# Patient Record
Sex: Male | Born: 1990 | Race: Black or African American | Hispanic: No | Marital: Single | Smoking: Current every day smoker
Health system: Southern US, Community
[De-identification: ages and names within clinical notes are randomized; demographics above are authoritative.]

## PROBLEM LIST (undated history)

## (undated) DIAGNOSIS — I10 Essential (primary) hypertension: Secondary | ICD-10-CM

## (undated) DIAGNOSIS — E119 Type 2 diabetes mellitus without complications: Secondary | ICD-10-CM

## (undated) DIAGNOSIS — E785 Hyperlipidemia, unspecified: Secondary | ICD-10-CM

## (undated) HISTORY — DX: Type 2 diabetes mellitus without complications: E11.9

---
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
## 2014-06-24 ENCOUNTER — Encounter (HOSPITAL_COMMUNITY): Payer: Self-pay | Admitting: Emergency Medicine

## 2014-06-24 ENCOUNTER — Emergency Department (HOSPITAL_COMMUNITY): Payer: Self-pay

## 2014-06-24 ENCOUNTER — Emergency Department (HOSPITAL_COMMUNITY)
Admission: EM | Admit: 2014-06-24 | Discharge: 2014-06-24 | Disposition: A | Discharge status: Home / Self Care | Payer: Self-pay | Attending: Emergency Medicine | Admitting: Emergency Medicine

## 2014-06-24 DIAGNOSIS — S61411A Laceration without foreign body of right hand, initial encounter: Secondary | ICD-10-CM | POA: Insufficient documentation

## 2014-06-24 DIAGNOSIS — W25XXXA Contact with sharp glass, initial encounter: Secondary | ICD-10-CM | POA: Insufficient documentation

## 2014-06-24 DIAGNOSIS — Y998 Other external cause status: Secondary | ICD-10-CM | POA: Insufficient documentation

## 2014-06-24 DIAGNOSIS — Z23 Encounter for immunization: Secondary | ICD-10-CM | POA: Insufficient documentation

## 2014-06-24 DIAGNOSIS — Y9289 Other specified places as the place of occurrence of the external cause: Secondary | ICD-10-CM | POA: Insufficient documentation

## 2014-06-24 DIAGNOSIS — Y9389 Activity, other specified: Secondary | ICD-10-CM | POA: Insufficient documentation

## 2014-06-24 MED ORDER — TETANUS-DIPHTH-ACELL PERTUSSIS 5-2.5-18.5 LF-MCG/0.5 IM SUSP
0.5000 mL | Freq: Once | INTRAMUSCULAR | Status: AC
  Administered 2014-06-24: 0.5 mL via INTRAMUSCULAR
  Filled 2014-06-24: qty 0.5

## 2014-06-24 MED ORDER — HYDROCODONE-ACETAMINOPHEN 5-325 MG PO TABS: 1.0000 | ORAL_TABLET | ORAL | Status: DC | PRN

## 2014-06-24 MED ORDER — CEPHALEXIN 250 MG PO CAPS
500.0000 mg | ORAL_CAPSULE | Freq: Once | ORAL | Status: AC
  Administered 2014-06-24: 500 mg via ORAL
  Filled 2014-06-24: qty 2

## 2014-06-24 MED ORDER — LIDOCAINE HCL 2 % IJ SOLN
20.0000 mL | Freq: Once | INTRAMUSCULAR | Status: AC
  Administered 2014-06-24: 400 mg
  Filled 2014-06-24: qty 20

## 2014-06-24 MED ORDER — CEPHALEXIN 500 MG PO CAPS: 500.0000 mg | ORAL_CAPSULE | Freq: Four times a day (QID) | ORAL | Status: DC

## 2014-06-24 NOTE — ED Provider Notes (Signed)
CSN: [id number]     Arrival date & time 06/24/14  1115 History  This chart was scribed for non-physician practitioner, Marlon Peliffany Lanee Chain, PA-C, working with Lorre NickAnthony Allen, MD, by Ronney LionSuzanne Le, ED Scribe. This patient was seen in room TR09C/TR09C and the patient's care was started at 1:53 PM.      Chief Complaint  Patient presents with  . Extremity Laceration   The history is provided by the patient. No language interpreter was used.    HPI Comments: Jerome Hill is a 24 y.o. male who presents to the Emergency Department complaining of a deep laceration to his right hand, to the first and second digits. Patient was holding a mug this morning during breakfast when he set it too hard on the counter it broke  and cut his hand. He states he can't remember his last tetanus shot.   History reviewed. No pertinent past medical history. History reviewed. No pertinent past surgical history. History reviewed. No pertinent family history. History  Substance Use Topics  . Smoking status: Never Smoker   . Smokeless tobacco: Not on file  . Alcohol Use: No    Review of Systems  A complete 10 system review of systems was obtained and all systems are negative except as noted in the HPI and PMH.    Allergies  Review of patient's allergies indicates no known allergies.  Home Medications   Prior to Admission medications   Medication Sig Start Date End Date Taking? Authorizing Provider  cephALEXin (KEFLEX) 500 MG capsule Take 1 capsule (500 mg total) by mouth 4 (four) times daily. 06/24/14   Marlon Peliffany Fines Kimberlin, PA-C  HYDROcodone-acetaminophen (NORCO/VICODIN) 5-325 MG per tablet Take 1-2 tablets by mouth every 4 (four) hours as needed. 06/24/14   Zianne Schubring Neva SeatGreene, PA-C   BP 145/88 mmHg  Pulse 77  Temp(Src) 98.3 F (36.8 C) (Oral)  Resp 20  Ht 6' (1.829 m)  Wt 270 lb (122.471 kg)  BMI 36.61 kg/m2  SpO2 99% Physical Exam  Constitutional: He is oriented to person, place, and time. He appears well-developed  and well-nourished. No distress.  HENT:  Head: Normocephalic and atraumatic.  Eyes: Conjunctivae and EOM are normal.  Neck: Neck supple. No tracheal deviation present.  Cardiovascular: Normal rate.   Pulmonary/Chest: Effort normal. No respiratory distress.  Musculoskeletal: Normal range of motion.       Right hand: He exhibits laceration.       Hands: Patient is able to flex and extend his right index finger. He has intact lateral movements.  He reports normal sensation to touch on the tip of his finger. CR < 3 seconds.    PIP, MIP, DIP each isolated and he has extension and flexion in tact. Patient has strength on flexion and extension against resistance.  The wound is a flap largely over the PIP of the index finger. The wound is deep and when the flap is pulled back tendon is exposed. It appears intact and moves smoothly with finger movements. Wound is not actively bleeding.  Neurological: He is alert and oriented to person, place, and time.  Skin: Skin is warm and dry.  Psychiatric: He has a normal mood and affect. His behavior is normal.  Nursing note and vitals reviewed.   ED Course  Procedures (including critical care time)  DIAGNOSTIC STUDIES: Oxygen Saturation is 98% on room air, normal by my interpretation.    COORDINATION OF CARE: 1:55 PM - Discussed treatment plan with pt at bedside which includes suture repair,  and pt agreed to plan.  LACERATION REPAIR PROCEDURE NOTE The patient's identification was confirmed and consent was obtained. This procedure was performed by Marlon Pel, PA-C, working with Lorre Nick, MD at 2:46 PM. Site: Right hand Sterile procedures observed Anesthetic used (type and amt): Lidocaine 1% Suture type/size:  4-0 prolene; 4-0 chromic gut sutures placed subcutaneously Length: 10 cm  # of Sutures: 18 Technique:  15 x subcutaneous sutures; 3 x simple interrupted external sutures Complexity: complex Antibx ointment applied Tetanus  ordered Site anesthetized, irrigated with NS, explored with evidence of foreign body, wound well approximated, site covered with dry, sterile dressing.  Patient tolerated procedure well without complications. Instructions for care discussed verbally and patient provided with additional written instructions for homecare and f/u.    Imaging Review Dg Hand 2 View Right  06/24/2014   CLINICAL DATA:  Laceration between the first and second digits today.  EXAM: RIGHT HAND - 2 VIEW  COMPARISON:  None.  FINDINGS: There is no osseous abnormality. There appear to be several small foreign bodies overlying the proximal second digit. These could be in the soft tissues or on the skin or bandage.  IMPRESSION: No osseous abnormality.  Possible foreign bodies as described.   Electronically Signed   By: Francene Boyers M.D.   On: 06/24/2014 13:37    MDM   Final diagnoses:  Hand laceration, right, initial encounter   Will give tetanus shot and first dose of Keflex here. Will be placed in buddy-tape splint. Patient is to follow-up with hand in 10 days to have sutures removed and injury re-evaluated. Recommend ice to decrease swelling. and rest and to limit use of finger as to not stretch the stitches.  Medications  lidocaine (XYLOCAINE) 2 % (with pres) injection 400 mg (400 mg Other Given 06/24/14 1419)  Tdap (BOOSTRIX) injection 0.5 mL (0.5 mLs Intramuscular Given 06/24/14 1547)  cephALEXin (KEFLEX) capsule 500 mg (500 mg Oral Given 06/24/14 1551)    Filed Vitals:   06/24/14 1538  BP: 145/88  Pulse: 77  Temp: 98.3 F (36.8 C)  Resp: 20    I personally performed the services described in this documentation, which was scribed in my presence. The recorded information has been reviewed and is accurate.    Marlon Pel, PA-C 06/26/14 9604  Lorre Nick, MD 06/26/14 1003

## 2014-06-24 NOTE — ED Notes (Signed)
Declined W/C at D/C and was escorted to lobby by RN. 

## 2014-06-24 NOTE — ED Notes (Signed)
Deep laceration to right hand with drinking glass. Bleeding controlled. Sensation intact.

## 2014-06-24 NOTE — ED Notes (Signed)
Pt with laceration to right hand from glass today; bleeding controlled and CMS intact

## 2014-06-24 NOTE — ED Notes (Signed)
PA-C Neva SeatGreene at bedside.

## 2014-06-24 NOTE — ED Notes (Signed)
Suture cart at bedside. Laceration procedure equipment ready at bedside.

## 2014-06-24 NOTE — Discharge Instructions (Signed)

## 2014-06-24 NOTE — ED Notes (Signed)
Wound irrigated with normal saline. Multiple small white objects irrigated away. Per pt white particles are from tea cup. Wound covered with sterile gauze.

## 2014-07-08 ENCOUNTER — Encounter (HOSPITAL_COMMUNITY): Payer: Self-pay | Admitting: *Deleted

## 2014-07-08 ENCOUNTER — Emergency Department (HOSPITAL_COMMUNITY)
Admission: EM | Admit: 2014-07-08 | Discharge: 2014-07-08 | Disposition: A | Discharge status: Home / Self Care | Payer: Self-pay | Attending: Emergency Medicine | Admitting: Emergency Medicine

## 2014-07-08 DIAGNOSIS — Z720 Tobacco use: Secondary | ICD-10-CM | POA: Insufficient documentation

## 2014-07-08 DIAGNOSIS — Z4802 Encounter for removal of sutures: Secondary | ICD-10-CM | POA: Insufficient documentation

## 2014-07-08 DIAGNOSIS — Z792 Long term (current) use of antibiotics: Secondary | ICD-10-CM | POA: Insufficient documentation

## 2014-07-08 NOTE — ED Provider Notes (Signed)
CSN: [id number]     Arrival date & time 07/08/14  1339 History  This chart was scribed for non-physician practitioner, Marlon Pel, PA-C, working with Glynn Octave, MD, by Ronney Lion, ED Scribe. This patient was seen in room TR05C/TR05C and the patient's care was started at 2:27 PM.    Chief Complaint  Patient presents with  . Suture / Staple Removal   The history is provided by the patient and medical records. No language interpreter was used.   HPI Comments: Jerome Hill is a 24 y.o. male who presents to the Emergency Department for a suture removal for sutures that were placed on his right hand 2 weeks ago. Patient had a laceration after setting a mug too hard on the counter, which broke in his hand, per chart review. Patient has kept the area wrapped and clean. He denies any new symptoms or signs of infections.   History reviewed. No pertinent past medical history. History reviewed. No pertinent past surgical history. No family history on file. History  Substance Use Topics  . Smoking status: Current Some Day Smoker  . Smokeless tobacco: Not on file  . Alcohol Use: No    Review of Systems  A complete 10 system review of systems was obtained and all systems are negative except as noted in the HPI and PMH.    Allergies  Review of patient's allergies indicates no known allergies.  Home Medications   Prior to Admission medications   Medication Sig Start Date End Date Taking? Authorizing Provider  cephALEXin (KEFLEX) 500 MG capsule Take 1 capsule (500 mg total) by mouth 4 (four) times daily. 06/24/14   Marlon Pel, PA-C  HYDROcodone-acetaminophen (NORCO/VICODIN) 5-325 MG per tablet Take 1-2 tablets by mouth every 4 (four) hours as needed. 06/24/14   Cipriana Biller Neva Seat, PA-C   BP 128/69 mmHg  Pulse 89  Temp(Src) 98.6 F (37 C) (Oral)  Resp 18  Ht 6' (1.829 m)  Wt 270 lb (122.471 kg)  BMI 36.61 kg/m2  SpO2 98% Physical Exam  Constitutional: He is oriented to person,  place, and time. He appears well-developed and well-nourished. No distress.  HENT:  Head: Normocephalic and atraumatic.  Eyes: Conjunctivae and EOM are normal.  Neck: Neck supple. No tracheal deviation present.  Cardiovascular: Normal rate.   Pulmonary/Chest: Effort normal. No respiratory distress.  Musculoskeletal: Normal range of motion.  Neurological: He is alert and oriented to person, place, and time.  Skin: Skin is warm and dry.  Wound clean, dry, without rash. Wound is well-healing. No signs of infection, ROM of intact of right index finger.  Psychiatric: He has a normal mood and affect. His behavior is normal.  Nursing note and vitals reviewed.   ED Course  Procedures (including critical care time)  DIAGNOSTIC STUDIES: Oxygen Saturation is 98% on RA, normal by my interpretation.    COORDINATION OF CARE: 2:30 PM - Discussed treatment plan with pt at bedside which includes suture removal, and pt agreed to plan.  SUTURE REMOVAL Performed by: Marlon Pel, PA-C Authorized by: Glynn Octave, MD Consent: Verbal consent obtained. Consent given by: Patient  Required items: required blood products, implants, devices, and special equipment available  Time out: Immediately prior to procedure a "time out" was called to verify the correct patient, procedure, equipment, support staff and site/side marked as required. Location: Right hand Wound Appearance: clean, dry, without rash. Wound is well-healing.  Sutures Removed: 15 Post-removal: n/a Patient tolerance: Patient tolerated the procedure well with no immediate  complications.  MDM   Final diagnoses:  Visit for suture removal     Filed Vitals:   07/08/14 1400  BP: 128/69  Pulse: 89  Temp: 98.6 F (37 C)  Resp: 18     I personally performed the services described in this documentation, which was scribed in my presence. The recorded information has been reviewed and is accurate.      Marlon Peliffany Yecheskel Kurek,  PA-C 07/08/14 1447  Glynn OctaveStephen Rancour, MD 07/08/14 [phone]

## 2014-07-08 NOTE — Discharge Instructions (Signed)

## 2014-07-08 NOTE — ED Notes (Signed)
Pt is here to have sutures removed from right hand and were placed Monday two weeks ago.  Pt states looks clean and denies pain

## 2017-02-27 ENCOUNTER — Other Ambulatory Visit: Payer: Self-pay

## 2017-02-27 ENCOUNTER — Emergency Department (HOSPITAL_COMMUNITY)
Admission: EM | Admit: 2017-02-27 | Discharge: 2017-02-27 | Disposition: A | Discharge status: Home / Self Care | Payer: BLUE CROSS/BLUE SHIELD | Attending: Emergency Medicine | Admitting: Emergency Medicine

## 2017-02-27 ENCOUNTER — Encounter (HOSPITAL_COMMUNITY): Payer: Self-pay | Admitting: Emergency Medicine

## 2017-02-27 DIAGNOSIS — K0889 Other specified disorders of teeth and supporting structures: Secondary | ICD-10-CM | POA: Diagnosis present

## 2017-02-27 DIAGNOSIS — F17200 Nicotine dependence, unspecified, uncomplicated: Secondary | ICD-10-CM | POA: Insufficient documentation

## 2017-02-27 MED ORDER — NAPROXEN 250 MG PO TABS
375.0000 mg | ORAL_TABLET | Freq: Once | ORAL | Status: AC
  Administered 2017-02-27: 375 mg via ORAL
  Filled 2017-02-27: qty 2

## 2017-02-27 MED ORDER — PENICILLIN V POTASSIUM 250 MG PO TABS
500.0000 mg | ORAL_TABLET | Freq: Once | ORAL | Status: AC
  Administered 2017-02-27: 500 mg via ORAL
  Filled 2017-02-27: qty 2

## 2017-02-27 MED ORDER — NAPROXEN 375 MG PO TABS: 375.0000 mg | ORAL_TABLET | Freq: Two times a day (BID) | ORAL | 0 refills | Status: DC

## 2017-02-27 MED ORDER — PENICILLIN V POTASSIUM 500 MG PO TABS: 500.0000 mg | ORAL_TABLET | Freq: Four times a day (QID) | ORAL | 0 refills | Status: AC

## 2017-02-27 MED ORDER — BUPIVACAINE HCL (PF) 0.5 % IJ SOLN
5.0000 mL | Freq: Once | INTRAMUSCULAR | Status: AC
  Administered 2017-02-27: 5 mL
  Filled 2017-02-27: qty 10

## 2017-02-27 NOTE — ED Provider Notes (Signed)
[facility] Provider Note   CSN: [id number] Arrival date & time: 02/27/17  1035     History   Chief Complaint Chief Complaint  Patient presents with  . Dental Pain    HPI Ranvir A Routh is a 26 y.o. male.  HPI    Jader A Shimkus is a 26 y.o. male who presents to the Emergency Department complaining of persistent, gradually worsening, right-sided, upper and lower dental pain beginning originally 4 months ago but worsening over the last 2 weeks. Pt describes their pain as throbbing and constant.  No treatments at home. Pain is exacerbated by chewing. They are not currently followed by dentistry, but has a prior relationship with a dentist who performed multiple crowns.  Pt denies facial swelling, fever, chills, difficulty breathing, difficulty swallowing.  No history of immunocompromise state.  History reviewed. No pertinent past medical history.  There are no active problems to display for this patient.   History reviewed. No pertinent surgical history.     Home Medications    Prior to Admission medications   Medication Sig Start Date End Date Taking? Authorizing Provider  cephALEXin (KEFLEX) 500 MG capsule Take 1 capsule (500 mg total) by mouth 4 (four) times daily. 06/24/14   Marlon PelGreene, Tiffany, PA-C  HYDROcodone-acetaminophen (NORCO/VICODIN) 5-325 MG per tablet Take 1-2 tablets by mouth every 4 (four) hours as needed. 06/24/14   Marlon PelGreene, Tiffany, PA-C  naproxen (NAPROSYN) 375 MG tablet Take 1 tablet (375 mg total) by mouth 2 (two) times daily. 02/27/17   Aviva KluverMurray, Niang Mitcheltree B, PA-C  penicillin v potassium (VEETID) 500 MG tablet Take 1 tablet (500 mg total) by mouth 4 (four) times daily for 5 days. 02/27/17 03/04/17  Elisha PonderMurray, Leevon Upperman B, PA-C    Family History No family history on file.  Social History Social History   Tobacco Use  . Smoking status: Current Every Day Smoker  . Smokeless tobacco: Never Used  Substance Use Topics  .  Alcohol use: No    Frequency: Never  . Drug use: Yes    Types: Marijuana     Allergies   Patient has no known allergies.   Review of Systems Review of Systems  Constitutional: Negative for chills and fever.  HENT: Positive for dental problem. Negative for facial swelling, mouth sores, sore throat, trouble swallowing and voice change.   Respiratory: Negative for stridor.      Physical Exam Updated Vital Signs BP (!) 149/91   Pulse 98   Temp 98.7 F (37.1 C) (Oral)   Resp 16   SpO2 100%   Physical Exam  Constitutional: He appears well-developed and well-nourished. No distress.  Sitting comfortably in bed.  HENT:  Head: Normocephalic and atraumatic.  Mouth/Throat:    Dental cavities and poor oral dentition noted, particularly on right side of mouth. Pain along tooth  as depicted in image. No abscess noted. Midline uvula. No trismus. OP clear and moist. No oropharyngeal erythema or edema. Neck supple with no tenderness. No facial edema.  Eyes: Conjunctivae are normal. Right eye exhibits no discharge. Left eye exhibits no discharge.  EOMs normal to gross examination.  Neck: Normal range of motion.  Cardiovascular: Normal rate and regular rhythm.  Intact, 2+ radial pulse. Nontachycardic.  Pulmonary/Chest:  Normal respiratory effort. Patient converses comfortably. No audible wheeze or stridor.  Abdominal: He exhibits no distension.  Musculoskeletal: Normal range of motion.  Neurological: He is alert.  Cranial nerves intact to gross observation. Patient moves extremities without  difficulty.  Skin: Skin is warm and dry. He is not diaphoretic.  Psychiatric: He has a normal mood and affect. His behavior is normal. Judgment and thought content normal.  Nursing note and vitals reviewed.    ED Treatments / Results  Labs (all labs ordered are listed, but only abnormal results are displayed) Labs Reviewed - No data to display  EKG  EKG Interpretation None        Radiology No results found.  Procedures Dental Block Date/Time: 02/27/2017 11:23 AM Performed by: Elisha PonderMurray, Fenix Ruppe B, PA-C Authorized by: Elisha PonderMurray, Tevita Gomer B, PA-C   Consent:    Consent obtained:  Verbal   Consent given by:  Patient   Risks discussed:  Unsuccessful block   Alternatives discussed:  No treatment Indications:    Indications: dental pain   Location:    Block type:  Supraperiosteal   Supraperiosteal location:  Upper teeth and lower teeth   Upper teeth location:  1/RU 3rd molar   Lower teeth location:  30/RL 1st molar Procedure details (see MAR for exact dosages):    Anesthetic injected:  Bupivacaine 0.5% WITH epi   Injection procedure:  Anatomic landmarks identified, introduced needle, anatomic landmarks palpated and incremental injection Post-procedure details:    Outcome:  Anesthesia achieved   Patient tolerance of procedure:  Tolerated well, no immediate complications   (including critical care time)  Medications Ordered in ED Medications  penicillin v potassium (VEETID) tablet 500 mg (not administered)  naproxen (NAPROSYN) tablet 375 mg (not administered)  bupivacaine (MARCAINE) 0.5 % injection 5 mL (not administered)     Initial Impression / Assessment and Plan / ED Course  I have reviewed the triage vital signs and the nursing notes.  Pertinent labs & imaging results that were available during my care of the patient were reviewed by me and considered in my medical decision making (see chart for details).       Final Clinical Impressions(s) / ED Diagnoses   Final diagnoses:  Pain, dental   Kannan A Haldeman is a 26 y.o. male who presents to ED for dental pain. No abscess requiring immediate incision and drainage. Patient is afebrile, non toxic appearing, and swallowing secretions well. Exam not concerning for Ludwig's angina or pharyngeal abscess. Will treat with Penicillin and Naproxen. I provided dental resource guide and stressed the importance  of dental follow up for ultimate management of dental pain. Patient voices understanding and is agreeable to plan.   ED Discharge Orders        Ordered    penicillin v potassium (VEETID) 500 MG tablet  4 times daily     02/27/17 1102    naproxen (NAPROSYN) 375 MG tablet  2 times daily     02/27/17 [id number]       Chaka Jefferys B, PA-C 02/27/17 1125    Tilden Fossaees, Elizabeth, MD 03/01/17 [id number]

## 2017-02-27 NOTE — ED Triage Notes (Signed)
Rt sided dental pain upper and lower x 2 weeks

## 2017-02-27 NOTE — ED Notes (Signed)
Declined W/C at D/C and was escorted to lobby by RN. 

## 2017-02-27 NOTE — Discharge Instructions (Signed)
Please see the information and instructions below regarding your visit.  Your diagnoses today include:  1. Pain, dental    You have a likely dental infection. It is very important that you get evaluated by a dentist as soon as possible. Call tomorrow to schedule an appointment. Naproxen as needed for pain. Take your full course of antibiotics. Read the instructions below.  Tests performed today include: See side panel of your discharge paperwork for testing performed today. Vital signs are listed at the bottom of these instructions.   Medications prescribed:    Take any prescribed medications only as prescribed, and any over the counter medications only as directed on the packaging.  1. You are prescribed Penicillin, an antibiotic. Please take all of your antibiotics until finished.   You may develop abdominal discomfort or nausea from the antibiotic. If this occurs, you may take it with food. Some patients also get diarrhea with antibiotics. You may help offset this with probiotics which you can buy or get in yogurt. Do not eat or take the probiotics until 2 hours after your antibiotic.   Some people develop allergies to antibiotics. Symptoms of antibiotic allergy can be mild and include a flat rash and itching. They can also be more serious and include:  ?Hives - Hives are raised, red patches of skin that are usually very itchy.  ?Lip or tongue swelling  ?Trouble swallowing or breathing  ?Blistering of the skin or mouth.  If you have any of these serious symptoms, please seek emergency medical care immediately.  2. You are prescribed Naproxen, a non-steroidal anti-inflammatory agent (NSAID) for pain. You may take 375mg  every 6 hours as needed for pain. If still requiring this medication around the clock for acute pain after 10 days, please see your primary healthcare provider.  You may combine this medication with Tylenol, 650 mg every 6 hours, so you are receiving something for pain  every 3 hours.  This is not a long-term medication unless under the care and direction of your primary provider. Taking this medication long-term and not under the supervision of a healthcare provider could increase the risk of stomach ulcers, kidney problems, and cardiovascular problems such as high blood pressure.   Home care instructions:  Please follow any educational materials contained in this packet.   Eat a soft or liquid diet and rinse your mouth out after meals with warm water. You should see a dentist or return here at once if you have increased swelling, increased pain or uncontrolled bleeding from the site of your injury.  Follow-up instructions: It is very important that you see a dentist as soon as possible. There is a list of dentists attached to this packet if you do not have care established with a dentist already. Please give a call to a dentist of your choice tomorrow.  Return instructions:  Please return to the Emergency Department if you experience worsening symptoms.  Please seek care if you note any of the following about your dental pain:  You have increased pain not controlled with medicines.  You have swelling around your tooth, in your face or neck.  You have bleeding which starts, continues, or gets worse.  You have a fever >101 If you are unable to open your mouth Please return if you have any other emergent concerns.  Additional Information:   Your vital signs today were: BP (!) 149/91    Pulse 98    Temp 98.7 F (37.1 C) (Oral)  Resp 16    SpO2 100%  If your blood pressure (BP) was elevated on multiple readings during this visit above 130 for the top number or above 80 for the bottom number, please have this repeated by your primary care provider within one month. --------------  Thank you for allowing us to participate in your care today.

## 2018-05-20 ENCOUNTER — Other Ambulatory Visit: Payer: Self-pay

## 2018-05-20 ENCOUNTER — Emergency Department (HOSPITAL_COMMUNITY)
Admission: EM | Admit: 2018-05-20 | Discharge: 2018-05-20 | Disposition: A | Discharge status: Home / Self Care | Payer: BLUE CROSS/BLUE SHIELD | Attending: Emergency Medicine | Admitting: Emergency Medicine

## 2018-05-20 ENCOUNTER — Encounter (HOSPITAL_COMMUNITY): Payer: Self-pay | Admitting: Emergency Medicine

## 2018-05-20 DIAGNOSIS — K047 Periapical abscess without sinus: Secondary | ICD-10-CM | POA: Insufficient documentation

## 2018-05-20 DIAGNOSIS — F17210 Nicotine dependence, cigarettes, uncomplicated: Secondary | ICD-10-CM | POA: Insufficient documentation

## 2018-05-20 MED ORDER — AMOXICILLIN 500 MG PO CAPS: 500.0000 mg | ORAL_CAPSULE | Freq: Three times a day (TID) | ORAL | 0 refills | Status: DC

## 2018-05-20 MED ORDER — NAPROXEN 500 MG PO TABS: 500.0000 mg | ORAL_TABLET | Freq: Two times a day (BID) | ORAL | 0 refills | Status: DC

## 2018-05-20 NOTE — Discharge Instructions (Addendum)
Take the medication as directed and follow up with a dentist as soon as possible. Return here for worsening symptoms.

## 2018-05-20 NOTE — ED Triage Notes (Signed)
Pt to ER for evaluation of left lower dental pain onset 3 days ago. Reports pain with eating. No fever or body aches.

## 2018-05-20 NOTE — ED Notes (Signed)
Patient verbalizes understanding of discharge instructions. Opportunity for questioning and answers were provided. Armband removed by staff, pt discharged from ED.  

## 2018-05-20 NOTE — ED Provider Notes (Signed)
[facility] EMERGENCY DEPARTMENT Provider Note   CSN: [id number] Arrival date & time: 05/20/18  1408    History   Chief Complaint Chief Complaint  Patient presents with  . Dental Injury    HPI Jerome Hill is a 28 y.o. male who presents to the ED for a dental pain. The pain started about 5 days ago and has gotten worse. The pain is located in the left lower dental area. Patient reports facial swelling but denies fever, chills other other problems.      The history is provided by the patient. No language interpreter was used.  Dental Pain  Location:  Lower Lower teeth location:  18/LL 2nd molar Quality:  Throbbing Severity:  Severe Onset quality:  Gradual Duration:  5 days Timing:  Constant Progression:  Worsening Context: abscess   Relieved by:  Nothing Worsened by:  Cold food/drink, jaw movement and pressure Ineffective treatments: aleve. Associated symptoms: gum swelling   Associated symptoms: no fever   Risk factors: lack of dental care and smoking     History reviewed. No pertinent past medical history.  There are no active problems to display for this patient.   History reviewed. No pertinent surgical history.      Home Medications    Prior to Admission medications   Medication Sig Start Date End Date Taking? Authorizing Provider  amoxicillin (AMOXIL) 500 MG capsule Take 1 capsule (500 mg total) by mouth 3 (three) times daily. 05/20/18   Janne Napoleon, NP  naproxen (NAPROSYN) 500 MG tablet Take 1 tablet (500 mg total) by mouth 2 (two) times daily. 05/20/18   Janne Napoleon, NP    Family History No family history on file.  Social History Social History   Tobacco Use  . Smoking status: Current Every Day Smoker  . Smokeless tobacco: Never Used  Substance Use Topics  . Alcohol use: No    Frequency: Never  . Drug use: Yes    Types: Marijuana     Allergies   Patient has no known allergies.   Review of Systems Review of  Systems  Constitutional: Negative for chills and fever.  HENT: Positive for dental problem.   Respiratory: Negative for cough and shortness of breath.   Cardiovascular: Negative for chest pain.  Gastrointestinal: Negative.   Hematological: Positive for adenopathy.     Physical Exam Updated Vital Signs BP 132/87 (BP Location: Right Arm)   Pulse 91   Temp 98.2 F (36.8 C) (Oral)   Resp 18   SpO2 96%   Physical Exam Vitals signs and nursing note reviewed.  Constitutional:      General: He is not in acute distress.    Appearance: He is well-developed.  HENT:     Head:     Comments: Mild facial swelling on the left.     Mouth/Throat:     Mouth: Mucous membranes are moist.     Dentition: Abnormal dentition. Gingival swelling present.     Pharynx: Oropharynx is clear.      Comments: Swelling and tenderness to the left lower dental area surrounding molars. No trismus, no TMJ pain or click.  Eyes:     Conjunctiva/sclera: Conjunctivae normal.  Neck:     Musculoskeletal: Neck supple.  Cardiovascular:     Rate and Rhythm: Normal rate.  Pulmonary:     Effort: Pulmonary effort is normal.  Abdominal:     Palpations: Abdomen is soft.     Tenderness: There is  no abdominal tenderness.  Musculoskeletal: Normal range of motion.  Skin:    General: Skin is warm and dry.  Neurological:     Mental Status: He is alert and oriented to person, place, and time.  Psychiatric:        Mood and Affect: Mood normal.      ED Treatments / Results  Labs (all labs ordered are listed, but only abnormal results are displayed) Labs Reviewed - No data to display  Radiology No results found.  Procedures Procedures (including critical care time)  Medications Ordered in ED Medications - No data to display   Initial Impression / Assessment and Plan / ED Course  I have reviewed the triage vital signs and the nursing notes. Patient with toothache.  No gross abscess.  Exam unconcerning for  Ludwig's angina or spread of infection.  Will treat with penicillin and anti-inflammatories medicine.  Urged patient to follow-up with dentist.    Final Clinical Impressions(s) / ED Diagnoses   Final diagnoses:  Dental abscess    ED Discharge Orders         Ordered    amoxicillin (AMOXIL) 500 MG capsule  3 times daily     05/20/18 1532    naproxen (NAPROSYN) 500 MG tablet  2 times daily     05/20/18 1532           Damian Leavell Samak, [place] 05/20/18 1536    Arby Barrette, MD 05/21/18 1208

## 2018-12-02 ENCOUNTER — Other Ambulatory Visit: Payer: Self-pay

## 2018-12-02 ENCOUNTER — Ambulatory Visit (HOSPITAL_COMMUNITY)
Admission: EM | Admit: 2018-12-02 | Discharge: 2018-12-02 | Disposition: A | Discharge status: Home / Self Care | Payer: Self-pay | Attending: Internal Medicine | Admitting: Internal Medicine

## 2018-12-02 ENCOUNTER — Encounter (HOSPITAL_COMMUNITY): Payer: Self-pay

## 2018-12-02 DIAGNOSIS — E119 Type 2 diabetes mellitus without complications: Secondary | ICD-10-CM | POA: Insufficient documentation

## 2018-12-02 LAB — CBC
HCT: 48.1 % (ref 39.0–52.0)
Hemoglobin: 16.7 g/dL (ref 13.0–17.0)
MCH: 29.7 pg (ref 26.0–34.0)
MCHC: 34.7 g/dL (ref 30.0–36.0)
MCV: 85.6 fL (ref 80.0–100.0)
Platelets: 183 10*3/uL (ref 150–400)
RBC: 5.62 MIL/uL (ref 4.22–5.81)
RDW: 11.8 % (ref 11.5–15.5)
WBC: 8.9 10*3/uL (ref 4.0–10.5)
nRBC: 0 % (ref 0.0–0.2)

## 2018-12-02 LAB — COMPREHENSIVE METABOLIC PANEL
ALT: 45 U/L — ABNORMAL HIGH (ref 0–44)
AST: 30 U/L (ref 15–41)
Albumin: 4.4 g/dL (ref 3.5–5.0)
Alkaline Phosphatase: 80 U/L (ref 38–126)
Anion gap: 13 (ref 5–15)
BUN: 11 mg/dL (ref 6–20)
CO2: 25 mmol/L (ref 22–32)
Calcium: 9.8 mg/dL (ref 8.9–10.3)
Chloride: 98 mmol/L (ref 98–111)
Creatinine, Ser: 1.04 mg/dL (ref 0.61–1.24)
GFR calc Af Amer: >60 mL/min (ref >60)
GFR calc non Af Amer: >60 mL/min (ref >60)
Glucose, Bld: 370 mg/dL — ABNORMAL HIGH (ref 70–99)
Potassium: 4.5 mmol/L (ref 3.5–5.1)
Sodium: 136 mmol/L (ref 135–145)
Total Bilirubin: 1.7 mg/dL — ABNORMAL HIGH (ref 0.3–1.2)
Total Protein: 7.6 g/dL (ref 6.5–8.1)

## 2018-12-02 LAB — GLUCOSE, CAPILLARY: Glucose-Capillary: 381 mg/dL — ABNORMAL HIGH (ref 70–99)

## 2018-12-02 LAB — POCT URINALYSIS DIP (DEVICE)
Bilirubin Urine: NEGATIVE
Glucose, UA: 500 mg/dL — AB
Hgb urine dipstick: NEGATIVE
Ketones, ur: 80 mg/dL — AB
Leukocytes,Ua: NEGATIVE
Nitrite: NEGATIVE
Protein, ur: NEGATIVE mg/dL
Specific Gravity, Urine: 1.02 (ref 1.005–1.030)
Urobilinogen, UA: 0.2 mg/dL (ref 0.0–1.0)
pH: 5.5 (ref 5.0–8.0)

## 2018-12-02 MED ORDER — METFORMIN HCL 500 MG PO TABS: 500.0000 mg | ORAL_TABLET | Freq: Two times a day (BID) | ORAL | 1 refills | Status: DC

## 2018-12-02 NOTE — ED Triage Notes (Addendum)
Patient report having urinary frequency x 2 weeks, tongue pain, mouth pain x 3 days. He tested positive for Chlamydia 1 week ago, [facility]. He is taking amoxicillin 500 mg, he had at his house.

## 2018-12-02 NOTE — ED Provider Notes (Addendum)
[place]    CSN: [id number] Arrival date & time: 12/02/18  1547      History   Chief Complaint Chief Complaint  Patient presents with  . Urinary Frequency    HPI Jerome Hill is a 28 y.o. male no past medical history comes to urgent care with complaints of a 2-week history of increased frequency of micturition.  Patient said he has been voiding several times a day.  He voids a lot each time.  It is associated with increased thirst and increasing fatigue.  No dysuria urgency or frequency.  No fever or chills.  He was treated for chlamydia infection last week with azithromycin.  He describes mild dryness.  No shortness of breath, HPI  History reviewed. No pertinent past medical history.  There are no active problems to display for this patient.   History reviewed. No pertinent surgical history.     Home Medications    Prior to Admission medications   Medication Sig Start Date End Date Taking? Authorizing Provider  naproxen (NAPROSYN) 500 MG tablet Take 1 tablet (500 mg total) by mouth 2 (two) times daily. 05/20/18   Ashley Murrain, NP    Family History History reviewed. No pertinent family history.  Social History Social History   Tobacco Use  . Smoking status: Current Every Day Smoker  . Smokeless tobacco: Never Used  Substance Use Topics  . Alcohol use: No    Frequency: Never  . Drug use: Yes    Types: Marijuana     Allergies   Patient has no known allergies.   Review of Systems Review of Systems  Constitutional: Positive for fatigue. Negative for unexpected weight change.  Respiratory: Negative.   Gastrointestinal: Negative.  Negative for abdominal pain, nausea and vomiting.  Endocrine: Negative for polydipsia, polyphagia and polyuria.  Genitourinary: Negative for decreased urine volume, discharge, dysuria, frequency, penile pain, penile swelling and urgency.  Musculoskeletal: Negative.   Neurological: Negative for dizziness, weakness  and light-headedness.     Physical Exam Triage Vital Signs ED Triage Vitals  Enc Vitals Group     BP 12/02/18 1624 139/82     Pulse Rate 12/02/18 1624 86     Resp 12/02/18 1624 18     Temp 12/02/18 1624 98.4 F (36.9 C)     Temp Source 12/02/18 1624 Oral     SpO2 12/02/18 1624 96 %     Weight --      Height --      Head Circumference --      Peak Flow --      Pain Score 12/02/18 1621 4     Pain Loc --      Pain Edu? --      Excl. in [place]? --    No data found.  Updated Vital Signs BP 139/82 (BP Location: Left Arm)   Pulse 86   Temp 98.4 F (36.9 C) (Oral)   Resp 18   SpO2 96%   Visual Acuity Right Eye Distance:   Left Eye Distance:   Bilateral Distance:    Right Eye Near:   Left Eye Near:    Bilateral Near:     Physical Exam Vitals signs and nursing note reviewed.  Constitutional:      Appearance: Normal appearance.  Cardiovascular:     Rate and Rhythm: Normal rate and regular rhythm.     Pulses: Normal pulses.     Heart sounds: Normal heart sounds.  Pulmonary:  Effort: Pulmonary effort is normal.     Breath sounds: Normal breath sounds.  Abdominal:     General: Bowel sounds are normal.     Palpations: Abdomen is soft.  Skin:    General: Skin is warm.     Capillary Refill: Capillary refill takes less than 2 seconds.  Neurological:     General: No focal deficit present.     Mental Status: He is alert and oriented to person, place, and time.      UC Treatments / Results  Labs (all labs ordered are listed, but only abnormal results are displayed) Labs Reviewed  GLUCOSE, CAPILLARY - Abnormal; Notable for the following components:      Result Value   Glucose-Capillary 381 (*)    All other components within normal limits  POCT URINALYSIS DIP (DEVICE) - Abnormal; Notable for the following components:   Glucose, UA 500 (*)    Ketones, ur 80 (*)    All other components within normal limits  CBC  COMPREHENSIVE METABOLIC PANEL  CBG MONITORING, ED     EKG   Radiology No results found.  Procedures Procedures (including critical care time)  Medications Ordered in UC Medications - No data to display  Initial Impression / Assessment and Plan / UC Course  I have reviewed the triage vital signs and the nursing notes.  Pertinent labs & imaging results that were available during my care of the patient were reviewed by me and considered in my medical decision making (see chart for details).     1.  Polyuria with polydipsia Point-of-care CBG blood glucose of 381 CBC, CMP pending Urinalysis negative for urinary tract infection Metformin 500 mg twice daily If CMP requires the patient to be hydrated then will send him to the ED to get hydration.  Patient to follow-up with [facility].  If patient's symptoms worsen he is welcome to return to the urgent care or emergency department for reevaluation. Final Clinical Impressions(s) / UC Diagnoses   Final diagnoses:  New onset type 2 diabetes mellitus (HCC)   Discharge Instructions   None    ED Prescriptions    None     PDMP not reviewed this encounter.   Merrilee Jansky, MD 12/02/18 1645    Merrilee Jansky, MD 12/02/18 [id number]

## 2018-12-04 ENCOUNTER — Telehealth (HOSPITAL_COMMUNITY): Payer: Self-pay | Admitting: Emergency Medicine

## 2018-12-04 NOTE — Telephone Encounter (Signed)
Elevated glucose, consistant with value in office. Patient contacted and made aware of    results, all questions answered, he will follow up with PCP asap.

## 2019-02-03 ENCOUNTER — Emergency Department (HOSPITAL_COMMUNITY)
Admission: EM | Admit: 2019-02-03 | Discharge: 2019-02-03 | Disposition: A | Discharge status: Home / Self Care | Payer: Self-pay | Attending: Emergency Medicine | Admitting: Emergency Medicine

## 2019-02-03 DIAGNOSIS — M545 Low back pain: Secondary | ICD-10-CM | POA: Insufficient documentation

## 2019-02-03 DIAGNOSIS — K0889 Other specified disorders of teeth and supporting structures: Secondary | ICD-10-CM | POA: Insufficient documentation

## 2019-02-03 DIAGNOSIS — Z5321 Procedure and treatment not carried out due to patient leaving prior to being seen by health care provider: Secondary | ICD-10-CM | POA: Insufficient documentation

## 2019-02-03 NOTE — ED Triage Notes (Signed)
Pt came in POV c/o of Tooth Pain. Started 02/02/2019 and has not got any better. Back lower side on the right. Patient has been taking aleve, percocet, and ibuprofen for pain management. No relief of pain at this time.

## 2019-02-17 ENCOUNTER — Encounter (HOSPITAL_COMMUNITY): Payer: Self-pay

## 2019-02-17 ENCOUNTER — Other Ambulatory Visit: Payer: Self-pay

## 2019-02-17 ENCOUNTER — Ambulatory Visit (HOSPITAL_COMMUNITY)
Admission: EM | Admit: 2019-02-17 | Discharge: 2019-02-17 | Disposition: A | Discharge status: Home / Self Care | Payer: Self-pay | Attending: Emergency Medicine | Admitting: Emergency Medicine

## 2019-02-17 DIAGNOSIS — K130 Diseases of lips: Secondary | ICD-10-CM

## 2019-02-17 DIAGNOSIS — K146 Glossodynia: Secondary | ICD-10-CM

## 2019-02-17 DIAGNOSIS — L0291 Cutaneous abscess, unspecified: Secondary | ICD-10-CM

## 2019-02-17 DIAGNOSIS — R739 Hyperglycemia, unspecified: Secondary | ICD-10-CM

## 2019-02-17 LAB — POCT URINALYSIS DIP (DEVICE)
Bilirubin Urine: NEGATIVE
Glucose, UA: 500 mg/dL — AB
Hgb urine dipstick: NEGATIVE
Ketones, ur: 40 mg/dL — AB
Leukocytes,Ua: NEGATIVE
Nitrite: NEGATIVE
Protein, ur: NEGATIVE mg/dL
Specific Gravity, Urine: 1.01 (ref 1.005–1.030)
Urobilinogen, UA: 0.2 mg/dL (ref 0.0–1.0)
pH: 5.5 (ref 5.0–8.0)

## 2019-02-17 LAB — GLUCOSE, CAPILLARY: Glucose-Capillary: 518 mg/dL (ref 70–99)

## 2019-02-17 LAB — CBG MONITORING, ED: Glucose-Capillary: 518 mg/dL (ref 70–99)

## 2019-02-17 MED ORDER — NYSTATIN 100000 UNIT/ML MT SUSP: 500000.0000 [IU] | Freq: Four times a day (QID) | OROMUCOSAL | 0 refills | Status: AC

## 2019-02-17 MED ORDER — METFORMIN HCL 500 MG PO TABS: 1000.0000 mg | ORAL_TABLET | Freq: Two times a day (BID) | ORAL | 0 refills | Status: DC

## 2019-02-17 MED ORDER — SULFAMETHOXAZOLE-TRIMETHOPRIM 800-160 MG PO TABS: 1.0000 | ORAL_TABLET | Freq: Two times a day (BID) | ORAL | 0 refills | Status: AC

## 2019-02-17 NOTE — Discharge Instructions (Signed)
Your blood sugar is very elevated tonight.  I would like you to start taking 1000mg  of your metformin to treat this.  Start tonight.  Increase your water intake.  Please see information about diet and diabetes to better manage your sugars.  Complete course of antibiotics.   Warm compresses or soaks to your abscess to promote drainage.  Use of nystatin liquid, swish and spit 4 times a day, I feel this will help with your tongue.  It is essential that you call on Monday to set up a PCP appointment for follow up of your blood sugar.  Any worsening of symptoms please go to the ER.

## 2019-02-17 NOTE — ED Notes (Signed)
Provider notified of blood glucose level

## 2019-02-17 NOTE — ED Triage Notes (Signed)
Pt presents with abscess on inner right thigh X 1 week that he states started draining today.

## 2019-02-18 NOTE — ED Provider Notes (Signed)
[place]    CSN: [id number] Arrival date & time: 02/17/19  1751      History   Chief Complaint Chief Complaint  Patient presents with  . Abscess    HPI Jerome Hill is a 28 y.o. male.   Jerome Hill presents with complaints of  Abscess to right groin which he noted approximately 1 week ago. Over the past three days it has increased in size and pain. He feels like since waiting he has noted moisture as if it may have started draining. He took a hot shower before arrival tonight. No fevers. No previous MRSA.   Prior to discharge patient then also states his tongue is very painful, feels very dry, burning, can't even eat certain foods due to the pain. Pain to corners of lips as well, feels like he can't even open his mouth fully or it will "tear" and cause pain. In further discussion he endorses increased thirst, not necessarily increased urination. Last visit 9/26 was diagnosed with new onset DM and started on metformin, here at [facility]. He states he has been taking this but has not followed up with a PCP yet. Doesn't check his blood sugars.    ROS per HPI, negative if not otherwise mentioned.      History reviewed. No pertinent past medical history.  There are no problems to display for this patient.   History reviewed. No pertinent surgical history.     Home Medications    Prior to Admission medications   Medication Sig Start Date End Date Taking? Authorizing Provider  metFORMIN (GLUCOPHAGE) 500 MG tablet Take 2 tablets (1,000 mg total) by mouth 2 (two) times daily with a meal. 02/17/19 04/18/19  Augusto Gamble B, NP  naproxen (NAPROSYN) 500 MG tablet Take 1 tablet (500 mg total) by mouth 2 (two) times daily. 05/20/18   Ashley Murrain, NP  nystatin (MYCOSTATIN) 100000 UNIT/ML suspension Take 5 mLs (500,000 Units total) by mouth 4 (four) times daily. Swish and spit 02/17/19   Augusto Gamble B, NP  sulfamethoxazole-trimethoprim (BACTRIM DS) 800-160 MG tablet  Take 1 tablet by mouth 2 (two) times daily for 7 days. 02/17/19 02/24/19  Zigmund Gottron, NP    Family History Family History  Family history unknown: Yes    Social History Social History   Tobacco Use  . Smoking status: Current Every Day Smoker  . Smokeless tobacco: Never Used  Substance Use Topics  . Alcohol use: No  . Drug use: Yes    Types: Marijuana     Allergies   Patient has no known allergies.   Review of Systems Review of Systems   Physical Exam Triage Vital Signs ED Triage Vitals [02/17/19 1801]  Enc Vitals Group     BP 134/84     Pulse Rate 94     Resp 17     Temp 98.6 F (37 C)     Temp Source Oral     SpO2 99 %     Weight      Height      Head Circumference      Peak Flow      Pain Score 7     Pain Loc      Pain Edu?      Excl. in [place]?    No data found.  Updated Vital Signs BP 134/84 (BP Location: Left Arm)   Pulse 94   Temp 98.6 F (37 C) (Oral)   Resp 17  SpO2 99%   Visual Acuity Right Eye Distance:   Left Eye Distance:   Bilateral Distance:    Right Eye Near:   Left Eye Near:    Bilateral Near:     Physical Exam Constitutional:      Appearance: He is well-developed.  HENT:     Head:     Comments: Tongue is red smooth and shiny, no coating or white, no plaques; no obvious fissures to lips noted  Cardiovascular:     Rate and Rhythm: Normal rate.  Pulmonary:     Effort: Pulmonary effort is normal.  Skin:    General: Skin is warm and dry.     Findings: Abscess present.          Comments: Approximately 1.5 cm raised fluctuant abscess, draining purulent and bloody drainage; with slight pressure application significant drainage out.   Neurological:     Mental Status: He is alert and oriented to person, place, and time.      UC Treatments / Results  Labs (all labs ordered are listed, but only abnormal results are displayed) Labs Reviewed  GLUCOSE, CAPILLARY - Abnormal; Notable for the following components:       Result Value   Glucose-Capillary 518 (*)    All other components within normal limits  CBG MONITORING, ED - Abnormal; Notable for the following components:   Glucose-Capillary 518 (*)    All other components within normal limits  POCT URINALYSIS DIP (DEVICE) - Abnormal; Notable for the following components:   Glucose, UA 500 (*)    Ketones, ur 40 (*)    All other components within normal limits    EKG   Radiology No results found.  Procedures Procedures (including critical care time)  Medications Ordered in UC Medications - No data to display  Initial Impression / Assessment and Plan / UC Course  I have reviewed the triage vital signs and the nursing notes.  Pertinent labs & imaging results that were available during my care of the patient were reviewed by me and considered in my medical decision making (see chart for details).     Draining abscess, no further indication for incision at this time, compresses and antibiotics recommended. Blood sugar is elevated at 518, he states he has been taking metformin. CMP was WNL at last visit (besides blood sugar), opted to increase metformin. Ketones and sugar to urine similar to last visit. Nystatin provided for tongue and mouth. Emphasized diet, exercises and close follow up with a PCP for blood sugar management with return precautions discussed. Patient verbalized understanding and agreeable to plan.    Final Clinical Impressions(s) / UC Diagnoses   Final diagnoses:  Abscess  Tongue pain  Cheilitis  Hyperglycemia     Discharge Instructions     Your blood sugar is very elevated tonight.  I would like you to start taking 1000mg  of your metformin to treat this.  Start tonight.  Increase your water intake.  Please see information about diet and diabetes to better manage your sugars.  Complete course of antibiotics.   Warm compresses or soaks to your abscess to promote drainage.  Use of nystatin liquid, swish and spit 4 times  a day, I feel this will help with your tongue.  It is essential that you call on Monday to set up a PCP appointment for follow up of your blood sugar.  Any worsening of symptoms please go to the ER.     ED Prescriptions  Medication Sig Dispense Auth. Provider   sulfamethoxazole-trimethoprim (BACTRIM DS) 800-160 MG tablet Take 1 tablet by mouth 2 (two) times daily for 7 days. 14 tablet Linus Mako B, NP   nystatin (MYCOSTATIN) 100000 UNIT/ML suspension Take 5 mLs (500,000 Units total) by mouth 4 (four) times daily. Swish and spit 60 mL Linus Mako B, NP   metFORMIN (GLUCOPHAGE) 500 MG tablet Take 2 tablets (1,000 mg total) by mouth 2 (two) times daily with a meal. 240 tablet Georgetta Haber, NP     PDMP not reviewed this encounter.   Georgetta Haber, NP 02/18/19 1001

## 2019-02-26 ENCOUNTER — Other Ambulatory Visit: Payer: Self-pay

## 2019-02-27 ENCOUNTER — Ambulatory Visit (INDEPENDENT_AMBULATORY_CARE_PROVIDER_SITE_OTHER): Payer: Self-pay | Admitting: Family Medicine

## 2019-02-27 ENCOUNTER — Encounter: Payer: Self-pay | Admitting: Family Medicine

## 2019-02-27 VITALS — BP 114/62 | HR 88 | Temp 98.1°F | Ht 72.0 in | Wt 239.2 lb

## 2019-02-27 DIAGNOSIS — E119 Type 2 diabetes mellitus without complications: Secondary | ICD-10-CM | POA: Insufficient documentation

## 2019-02-27 DIAGNOSIS — E1010 Type 1 diabetes mellitus with ketoacidosis without coma: Secondary | ICD-10-CM

## 2019-02-27 NOTE — Progress Notes (Signed)
New Patient Office Visit  Subjective:  Patient ID: Jerome Hill, male    DOB: Feb 22, 1991  Age: 28 y.o. MRN: [id number]  CC:  Chief Complaint  Patient presents with  . New Patient (Initial Visit)  . Urinary Frequency    started x 3 months ago, denies dysuria  . Weight Loss    pt states he lost 80lbs over the past 3 months, not on purpose  . dry mouth    HPI Jerome Hill presents for evaluation and treatment of a 26-month history of urinary frequency with an 80 pound weight loss.  Patient has been consistently dehydrated despite the intake of copious fluids.  He has no family history of diabetes.  Vision has been blurry at times.  There is been some fatigue.  Blood sugar was elevated with urinary ketones back in September and again 10 days ago.  Patient was started on Glucophage but lost to follow-up.  He is not checking his blood sugars but is uncertain as to whether or not the Glucophage is helping.  He is an Event organiser.  Past Medical History:  Diagnosis Date  . Diabetes mellitus without complication (HCC)     No past surgical history on file.  Family History  Family history unknown: Yes    Social History   Socioeconomic History  . Marital status: Single    Spouse name: Not on file  . Number of children: Not on file  . Years of education: Not on file  . Highest education level: Not on file  Occupational History  . Not on file  Tobacco Use  . Smoking status: Current Every Day Smoker  . Smokeless tobacco: Never Used  Substance and Sexual Activity  . Alcohol use: No  . Drug use: Yes    Types: Marijuana  . Sexual activity: Not on file  Other Topics Concern  . Not on file  Social History Narrative  . Not on file   Social Determinants of Health   Financial Resource Strain:   . Difficulty of Paying Living Expenses: Not on file  Food Insecurity:   . Worried About Programme researcher, broadcasting/film/video in the Last Year: Not on file  . Ran Out of Food in the Last Year:  Not on file  Transportation Needs:   . Lack of Transportation (Medical): Not on file  . Lack of Transportation (Non-Medical): Not on file  Physical Activity:   . Days of Exercise per Week: Not on file  . Minutes of Exercise per Session: Not on file  Stress:   . Feeling of Stress : Not on file  Social Connections:   . Frequency of Communication with Friends and Family: Not on file  . Frequency of Social Gatherings with Friends and Family: Not on file  . Attends Religious Services: Not on file  . Active Member of Clubs or Organizations: Not on file  . Attends Banker Meetings: Not on file  . Marital Status: Not on file  Intimate Partner Violence:   . Fear of Current or Ex-Partner: Not on file  . Emotionally Abused: Not on file  . Physically Abused: Not on file  . Sexually Abused: Not on file    ROS Review of Systems  Constitutional: Positive for fatigue and unexpected weight change. Negative for chills, diaphoresis and fever.  HENT: Negative.   Respiratory: Negative.   Cardiovascular: Negative.   Gastrointestinal: Negative.   Endocrine: Positive for polyuria.  Genitourinary: Positive for frequency. Negative for  difficulty urinating, dysuria and urgency.  Skin: Negative for pallor and rash.  Neurological: Negative for syncope, speech difficulty and weakness.  Hematological: Negative.   Psychiatric/Behavioral: Negative.     Objective:   Today's Vitals: BP 114/62   Pulse 88   Temp 98.1 F (36.7 C)   Ht 6' (1.829 m)   Wt 239 lb 3.2 oz (108.5 kg)   SpO2 96%   BMI 32.44 kg/m   Physical Exam Vitals and nursing note reviewed.  Constitutional:      General: He is not in acute distress.    Appearance: Normal appearance. He is not ill-appearing, toxic-appearing or diaphoretic.  HENT:     Head: Normocephalic and atraumatic.     Right Ear: Tympanic membrane and ear canal normal.     Left Ear: Tympanic membrane and ear canal normal.     Mouth/Throat:      Mouth: Mucous membranes are dry.     Pharynx: No oropharyngeal exudate or posterior oropharyngeal erythema.  Eyes:     Extraocular Movements: Extraocular movements intact.     Conjunctiva/sclera: Conjunctivae normal.     Pupils: Pupils are equal, round, and reactive to light.  Cardiovascular:     Rate and Rhythm: Normal rate and regular rhythm.  Pulmonary:     Effort: Pulmonary effort is normal.     Breath sounds: Normal breath sounds.  Musculoskeletal:     Cervical back: No rigidity or tenderness.  Lymphadenopathy:     Cervical: No cervical adenopathy.  Skin:    General: Skin is warm and dry.  Neurological:     Mental Status: He is alert and oriented to person, place, and time.  Psychiatric:        Mood and Affect: Mood normal.        Behavior: Behavior normal.     Assessment & Plan:   Problem List Items Addressed This Visit      Endocrine   Type 1 diabetes mellitus with ketoacidosis without coma ([place]) - Primary      Outpatient Encounter Medications as of 02/27/2019  Medication Sig  . metFORMIN (GLUCOPHAGE) 500 MG tablet Take 2 tablets (1,000 mg total) by mouth 2 (two) times daily with a meal.  . nystatin (MYCOSTATIN) 100000 UNIT/ML suspension Take 5 mLs (500,000 Units total) by mouth 4 (four) times daily. Swish and spit (Patient not taking: Reported on 02/27/2019)  . [DISCONTINUED] naproxen (NAPROSYN) 500 MG tablet Take 1 tablet (500 mg total) by mouth 2 (two) times daily. (Patient not taking: Reported on 02/27/2019)   No facility-administered encounter medications on file as of 02/27/2019.    Follow-up: Return To [facility] now for inpatient treatment of ketoacidosis..   Patient will follow up with me for physical seeing endocrinologist and stabilization of diabetes.   Libby Maw, MD

## 2019-02-27 NOTE — Patient Instructions (Signed)
Type 1 Diabetes Mellitus, Diagnosis, Adult  Type 1 diabetes (type 1 diabetes mellitus) is a long-term (chronic) disease. It happens when your pancreas does not make enough of a hormone called insulin. Insulin lets sugars (glucose) go into cells in your body. This gives you energy. If your body does not make enough insulin, sugars cannot get into cells. This causes high blood sugar (hyperglycemia). Your doctor will set treatment goals for you. Generally, you should have these blood sugar levels:  Before meals (preprandial): 80-130 mg/dL (4.4-7.2 mmol/L).  After meals (postprandial): below 180 mg/dL (10 mmol/L).  A1c (hemoglobin A1c) level: less than 7%. Follow these instructions at home: Questions to ask your doctor  You may want to ask these questions: ? Do I need to meet with a diabetes educator? ? Where can I find a support group for people with diabetes? ? What equipment will I need to care for myself at home? ? What diabetes medicines do I need? When should I take them? ? How often do I need to check my blood sugar? ? What number can I call if I have questions? ? When is my next doctor's visit? General instructions  Take over-the-counter and prescription medicines only as told by your doctor.  Keep all follow-up visits as told by your doctor. This is important. Contact a doctor if:  Your blood sugar is at or above 240 mg/dL (13.3 mmol/L) for 2 days in a row.  You have been sick for 2 days or more, and you are not getting better.  You have had a fever for 2 days or more, and you are not getting better.  You have any of these problems for more than 6 hours: ? You cannot eat or drink. ? You feel sick to your stomach (nauseous). ? You throw up (vomit). ? You have watery poop (diarrhea). Get help right away if:  Your blood sugar is lower than 54 mg/dL (3 mmol/L).  You get confused.  You have trouble: ? Thinking clearly. ? Breathing.  You have moderate or large ketone  levels in your pee (urine). Summary  Type 1 diabetes (type 1 diabetes mellitus) is a long-term disease. It happens when your pancreas does not make enough of a hormone called insulin.  Your doctor will set treatment goals for you.  Take over-the-counter and prescription medicines only as told by your doctor.  Keep all follow-up visits as told by your doctor. This is important. This information is not intended to replace advice given to you by your health care provider. Make sure you discuss any questions you have with your health care provider. Document Released: 06/16/2015 Document Revised: 02/04/2017 Document Reviewed: 03/28/2015 Elsevier Patient Education  2020 [employer].

## 2019-03-03 ENCOUNTER — Other Ambulatory Visit: Payer: Self-pay

## 2019-03-03 ENCOUNTER — Emergency Department (HOSPITAL_BASED_OUTPATIENT_CLINIC_OR_DEPARTMENT_OTHER)
Admission: EM | Admit: 2019-03-03 | Discharge: 2019-03-03 | Disposition: A | Discharge status: Home / Self Care | Payer: Self-pay | Attending: Emergency Medicine | Admitting: Emergency Medicine

## 2019-03-03 ENCOUNTER — Encounter (HOSPITAL_BASED_OUTPATIENT_CLINIC_OR_DEPARTMENT_OTHER): Payer: Self-pay | Admitting: Emergency Medicine

## 2019-03-03 DIAGNOSIS — F17200 Nicotine dependence, unspecified, uncomplicated: Secondary | ICD-10-CM | POA: Insufficient documentation

## 2019-03-03 DIAGNOSIS — E1065 Type 1 diabetes mellitus with hyperglycemia: Secondary | ICD-10-CM | POA: Insufficient documentation

## 2019-03-03 DIAGNOSIS — E1165 Type 2 diabetes mellitus with hyperglycemia: Secondary | ICD-10-CM

## 2019-03-03 DIAGNOSIS — Z7984 Long term (current) use of oral hypoglycemic drugs: Secondary | ICD-10-CM | POA: Insufficient documentation

## 2019-03-03 LAB — CBC
HCT: 45.7 % (ref 39.0–52.0)
Hemoglobin: 15.3 g/dL (ref 13.0–17.0)
MCH: 30.1 pg (ref 26.0–34.0)
MCHC: 33.5 g/dL (ref 30.0–36.0)
MCV: 90 fL (ref 80.0–100.0)
Platelets: 187 10*3/uL (ref 150–400)
RBC: 5.08 MIL/uL (ref 4.22–5.81)
RDW: 12.3 % (ref 11.5–15.5)
WBC: 10.5 10*3/uL (ref 4.0–10.5)
nRBC: 0 % (ref 0.0–0.2)

## 2019-03-03 LAB — BASIC METABOLIC PANEL
Anion gap: 11 (ref 5–15)
BUN: 12 mg/dL (ref 6–20)
CO2: 24 mmol/L (ref 22–32)
Calcium: 9.1 mg/dL (ref 8.9–10.3)
Chloride: 98 mmol/L (ref 98–111)
Creatinine, Ser: 0.73 mg/dL (ref 0.61–1.24)
GFR calc Af Amer: >60 mL/min (ref >60)
GFR calc non Af Amer: >60 mL/min (ref >60)
Glucose, Bld: 395 mg/dL — ABNORMAL HIGH (ref 70–99)
Potassium: 3.7 mmol/L (ref 3.5–5.1)
Sodium: 133 mmol/L — ABNORMAL LOW (ref 135–145)

## 2019-03-03 LAB — CBG MONITORING, ED: Glucose-Capillary: 362 mg/dL — ABNORMAL HIGH (ref 70–99)

## 2019-03-03 MED ORDER — GLIMEPIRIDE 4 MG PO TABS: 2.0000 mg | ORAL_TABLET | Freq: Every day | ORAL | 0 refills | Status: DC

## 2019-03-03 NOTE — ED Triage Notes (Signed)
Pt c/o left side flank pain x several months. Pt reports recent diagnosis of type 1 diabetes. Pt also c/o frequent urination and excessive thirst. Pt reports being sent to ED by PCP. Pt unsure why being sent to ED.

## 2019-03-03 NOTE — Discharge Instructions (Addendum)
I have given a note to her nurse case manager to reach out to you to get you set up with one of our clinic physicians.  I am including a medication called Amaryl.  Please read the attached information.  Return to the emergency department if you are having the following symptoms or symptoms of low blood sugar.  Contact a health care provider if: Your blood glucose is at or above 240 mg/dL (13.3 mmol/L) for 2 days in a row. You have problems keeping your blood glucose in your target range. You have frequent episodes of hyperglycemia. Get help right away if: You have difficulty breathing. You have a change in how you think, feel, or act (mental status). You have nausea or vomiting that does not go a

## 2019-03-03 NOTE — ED Provider Notes (Signed)
[facility] EMERGENCY DEPARTMENT Provider Note   CSN: [id number] Arrival date & time: 03/03/19  1754     History Chief Complaint  Patient presents with  . Flank Pain  . Hyperglycemia    Jerome Hill is a 28 y.o. male who was recently diagnosed with diabetes at an urgent care.  He had oral candidiasis.  He was started on Glucophage 500 mg twice daily and bumped up to 1000 mg twice daily.  He states that he was told to follow-up with a primary care physician and states that he went but was immediately told that he needed to leave and go to the emergency department because he was a type I diabetic with DKA although he says that he had no labs drawn that day.  Patient states that he saw the doctor 1 week ago and was told to go to the ER but just presented tonight.  He states "I am totally confused about what to do."  He states that he is new to diabetes and is not sure how to manage it well and is trying to follow-up to take care of himself but has had no success so far.  Triage note states that he had flank pain.  Patient states that he was having some low back pain a couple weeks ago that radiated around the side.  Is worse with twisting and movement.  He had no urinary symptoms fever chills nausea vomiting hematuria or other symptoms of kidney stone.  He has no pain at this time.  HPI     Past Medical History:  Diagnosis Date  . Diabetes mellitus without complication [facility])     Patient Active Problem List   Diagnosis Date Noted  . Type 1 diabetes mellitus with ketoacidosis without coma (HCC) 02/27/2019    History reviewed. No pertinent surgical history.     Family History  Family history unknown: Yes    Social History   Tobacco Use  . Smoking status: Current Every Day Smoker  . Smokeless tobacco: Never Used  Substance Use Topics  . Alcohol use: No  . Drug use: Yes    Types: Marijuana    Home Medications Prior to Admission medications   Medication Sig  Start Date End Date Taking? Authorizing Provider  metFORMIN (GLUCOPHAGE) 500 MG tablet Take 2 tablets (1,000 mg total) by mouth 2 (two) times daily with a meal. 02/17/19 04/18/19  Linus Mako B, NP  nystatin (MYCOSTATIN) 100000 UNIT/ML suspension Take 5 mLs (500,000 Units total) by mouth 4 (four) times daily. Swish and spit Patient not taking: Reported on 02/27/2019 02/17/19   Georgetta Haber, NP    Allergies    Patient has no known allergies.  Review of Systems   Review of Systems Ten systems reviewed and are negative for acute change, except as noted in the HPI.   Physical Exam Updated Vital Signs BP 119/77 (BP Location: Right Arm)   Pulse 77   Temp 98.6 F (37 C) (Oral)   Resp 18   Ht 6' (1.829 m)   Wt 107.2 kg   SpO2 100%   BMI 32.06 kg/m   Physical Exam Vitals and nursing note reviewed.  Constitutional:      General: He is not in acute distress.    Appearance: He is well-developed. He is not diaphoretic.  HENT:     Head: Normocephalic and atraumatic.  Eyes:     General: No scleral icterus.    Conjunctiva/sclera: Conjunctivae normal.  Cardiovascular:  Rate and Rhythm: Normal rate and regular rhythm.     Heart sounds: Normal heart sounds.  Pulmonary:     Effort: Pulmonary effort is normal. No respiratory distress.     Breath sounds: Normal breath sounds.  Abdominal:     Palpations: Abdomen is soft.     Tenderness: There is no abdominal tenderness. There is no right CVA tenderness or left CVA tenderness.  Musculoskeletal:     Cervical back: Normal range of motion and neck supple.     Comments: No midline lumbar spine pain.  No pain with movement including flexion extension rotation or lateral flexion.  Skin:    General: Skin is warm and dry.  Neurological:     Mental Status: He is alert.  Psychiatric:        Behavior: Behavior normal.     ED Results / Procedures / Treatments   Labs (all labs ordered are listed, but only abnormal results are  displayed) Labs Reviewed  BASIC METABOLIC PANEL - Abnormal; Notable for the following components:      Result Value   Sodium 133 (*)    Glucose, Bld 395 (*)    All other components within normal limits  CBG MONITORING, ED - Abnormal; Notable for the following components:   Glucose-Capillary 362 (*)    All other components within normal limits  CBC    EKG None  Radiology No results found.  Procedures Procedures (including critical care time)  Medications Ordered in ED Medications - No data to display  ED Course  I have reviewed the triage vital signs and the nursing notes.  Pertinent labs & imaging results that were available during my care of the patient were reviewed by me and considered in my medical decision making (see chart for details).    MDM Rules/Calculators/A&P                      28 year old male who was told to come to the ER by PCP 1 week ago.  Patient states that he has no active flank pain.  He is having difficulty navigating the medical system with his new diagnosis of diabetes.  Patient blood sugar is markedly elevated here however there is no evidence of DKA or HHS.  He has no flank pain.  He denies urinary symptoms.  CBC shows no elevated white blood cell count.  We will add Amaryl to his Metformin regimen.  I have contacted our nurse case manager to help the patient become established with one of our medical clinics as he is uninsured and will need close monitoring, diabetic education and medication management.  I did spend about 15 minutes in discussion with the patient about the diabetic disease process, nutritional counseling, medication counseling, signs and symptoms of hyper and hypoglycemia, reasons to seek immediate medical care at the emergency department.  Patient expresses his understanding using teach back method and appears appropriate for discharge at this time. Final Clinical Impression(s) / ED Diagnoses Final diagnoses:  None    Rx / DC  Orders ED Discharge Orders    None       Margarita Mail, PA-C 03/03/19 2333    Little, Wenda Overland, MD 03/04/19 1524

## 2019-03-03 NOTE — ED Notes (Signed)
Pt was referred to ED after last weeks urgent care visit- pt reports not taking his metformin for a few days last week and presented with urinary frequency and increased thirst. Pt reports he has been taking his metformin as prescribed since and denies urinary symptoms at this time. Pt c/o L sided flank pain that radiates into abdomen. Pt denies N/V/D.

## 2019-03-06 ENCOUNTER — Telehealth: Payer: Self-pay

## 2019-03-06 NOTE — Telephone Encounter (Signed)
Message received from Laurena Slimmer, RN CM requesting a hospital follow up appointment @ [facility]   Call placed to patient and scheduled an appt for 03/12/2019 @ 1330.  Provided patient with the address for the clinic. He said that he had a PCP but wants to establish care with a new one.   He said that he has his medications as well as a glucometer. He has not yet checked his blood sugar today. Instructed him to check fasting in the morning and at bedtime and to keep a log of the results, then bring the log to his appointment next week and the provider will give him further instructions regarding how frequently she would like him to check the blood sugars.

## 2019-03-12 ENCOUNTER — Other Ambulatory Visit: Payer: Self-pay

## 2019-03-12 ENCOUNTER — Encounter (INDEPENDENT_AMBULATORY_CARE_PROVIDER_SITE_OTHER): Payer: Self-pay | Admitting: Primary Care

## 2019-03-12 ENCOUNTER — Ambulatory Visit (INDEPENDENT_AMBULATORY_CARE_PROVIDER_SITE_OTHER): Payer: Self-pay | Admitting: Primary Care

## 2019-03-12 VITALS — BP 120/81 | HR 88 | Temp 97.3°F | Ht 72.0 in | Wt 241.0 lb

## 2019-03-12 DIAGNOSIS — Z6832 Body mass index (BMI) 32.0-32.9, adult: Secondary | ICD-10-CM

## 2019-03-12 DIAGNOSIS — Z114 Encounter for screening for human immunodeficiency virus [HIV]: Secondary | ICD-10-CM

## 2019-03-12 DIAGNOSIS — Z131 Encounter for screening for diabetes mellitus: Secondary | ICD-10-CM

## 2019-03-12 DIAGNOSIS — F17200 Nicotine dependence, unspecified, uncomplicated: Secondary | ICD-10-CM

## 2019-03-12 DIAGNOSIS — E6609 Other obesity due to excess calories: Secondary | ICD-10-CM

## 2019-03-12 DIAGNOSIS — E119 Type 2 diabetes mellitus without complications: Secondary | ICD-10-CM

## 2019-03-12 LAB — POCT GLYCOSYLATED HEMOGLOBIN (HGB A1C): Hemoglobin A1C: 13.9 % — AB (ref 4.0–5.6)

## 2019-03-12 LAB — GLUCOSE, POCT (MANUAL RESULT ENTRY): POC Glucose: 131 mg/dl — AB (ref 70–99)

## 2019-03-12 MED ORDER — METFORMIN HCL 1000 MG PO TABS: 1000.0000 mg | ORAL_TABLET | Freq: Two times a day (BID) | ORAL | 1 refills | Status: DC

## 2019-03-12 MED ORDER — BASAGLAR KWIKPEN 100 UNIT/ML ~~LOC~~ SOPN: 16.0000 [IU] | PEN_INJECTOR | Freq: Every day | SUBCUTANEOUS | 1 refills | Status: DC

## 2019-03-12 MED ORDER — PRAVASTATIN SODIUM 20 MG PO TABS: 20.0000 mg | ORAL_TABLET | Freq: Every day | ORAL | 1 refills | Status: DC

## 2019-03-12 MED ORDER — LISINOPRIL 2.5 MG PO TABS: 2.5000 mg | ORAL_TABLET | Freq: Every day | ORAL | 1 refills | Status: DC

## 2019-03-12 NOTE — Patient Instructions (Signed)

## 2019-03-12 NOTE — Progress Notes (Signed)
New Patient Office Visit  Subjective:  Patient ID: Jerome Hill, male    DOB: 1990/06/22  Age: 29 y.o. MRN: [id number]  CC:  Chief Complaint  Patient presents with  . New Patient (Initial Visit)    DM/ diabetic education     HPI Jerome Hill presents for establishment of care and treatment of diabetes .  Past Medical History:  Diagnosis Date  . Diabetes mellitus without complication (HCC)     History reviewed. No pertinent surgical history.  Family History  Family history unknown: Yes    Social History   Socioeconomic History  . Marital status: Single    Spouse name: Not on file  . Number of children: Not on file  . Years of education: Not on file  . Highest education level: Not on file  Occupational History  . Not on file  Tobacco Use  . Smoking status: Current Every Day Smoker  . Smokeless tobacco: Never Used  Substance and Sexual Activity  . Alcohol use: No  . Drug use: Yes    Types: Marijuana  . Sexual activity: Not on file  Other Topics Concern  . Not on file  Social History Narrative  . Not on file   Social Determinants of Health   Financial Resource Strain:   . Difficulty of Paying Living Expenses: Not on file  Food Insecurity:   . Worried About Programme researcher, broadcasting/film/video in the Last Year: Not on file  . Ran Out of Food in the Last Year: Not on file  Transportation Needs:   . Lack of Transportation (Medical): Not on file  . Lack of Transportation (Non-Medical): Not on file  Physical Activity:   . Days of Exercise per Week: Not on file  . Minutes of Exercise per Session: Not on file  Stress:   . Feeling of Stress : Not on file  Social Connections:   . Frequency of Communication with Friends and Family: Not on file  . Frequency of Social Gatherings with Friends and Family: Not on file  . Attends Religious Services: Not on file  . Active Member of Clubs or Organizations: Not on file  . Attends Banker Meetings: Not on file  .  Marital Status: Not on file  Intimate Partner Violence:   . Fear of Current or Ex-Partner: Not on file  . Emotionally Abused: Not on file  . Physically Abused: Not on file  . Sexually Abused: Not on file    ROS Review of Systems  Endocrine: Positive for polydipsia, polyphagia and polyuria.  All other systems reviewed and are negative.   Objective:   Today's Vitals: BP 120/81 (BP Location: Right Arm, Patient Position: Sitting, Cuff Size: Large)   Pulse 88   Temp (!) 97.3 F (36.3 C) (Temporal)   Ht 6' (1.829 m)   Wt 241 lb (109.3 kg)   SpO2 98%   BMI 32.69 kg/m   Physical Exam Vitals reviewed.  Constitutional:      Appearance: He is obese.  HENT:     Right Ear: Tympanic membrane normal.     Left Ear: Tympanic membrane normal.  Eyes:     Extraocular Movements: Extraocular movements intact.     Pupils: Pupils are equal, round, and reactive to light.  Cardiovascular:     Rate and Rhythm: Normal rate and regular rhythm.  Pulmonary:     Effort: Pulmonary effort is normal.     Breath sounds: Normal breath sounds.  Abdominal:  General: Bowel sounds are normal.     Palpations: Abdomen is soft.  Musculoskeletal:        General: Normal range of motion.     Cervical back: Normal range of motion and neck supple.  Skin:    General: Skin is warm and dry.  Neurological:     Mental Status: He is alert and oriented to person, place, and time.  Psychiatric:        Mood and Affect: Mood normal.        Thought Content: Thought content normal.        Judgment: Judgment normal.     Assessment & Plan:  [name] was seen today for new patient (initial visit).  Diagnoses and all orders for this visit:  Screening for diabetes mellitus -     HgB A1c 13.9 -     Glucose (CBG)  Type 2 diabetes mellitus without complication, without long-term current use of insulin (HCC) Uncontrolled A1C 13.9  Goal of therapy: Less than 6.5 hemoglobin A1c. Discussed foods that are high in  carbohydrates are the following rice, potatoes, breads, sugars, and pastas.  Reduction in the intake (eating) will assist in lowering your blood sugars. Exercise or can contribute to weight loss, lowering blood sugar and reduce complicated with uncontrolled DM -     Lipid panel -     Microalbumin, urine  Tobacco dependence Discussed increased risk for lung cancer and other respiratory diseases recommend cessation.  This will be reminded at each clinical visit.  Class 1 obesity due to excess calories without serious comorbidity with body mass index (BMI) of 32.0 to 32.9 in adult Discussed diet and exercise for person with BMI >25. Instructed: You must burn more calories than you eat. Losing 5 percent of your body weight should be considered a success. In the longer term, losing more than 15 percent of your body weight and staying at this weight is an extremely good result. However, keep in mind that even losing 5 percent of your body weight leads to important health benefits, so try not to get discouraged if you're not able to lose more than this. Will recheck weight in 3-6 months.  Screening for HIV (human immunodeficiency virus) -     HIV antibody (with reflex)  Other orders -     metFORMIN (GLUCOPHAGE) 1000 MG tablet; Take 1 tablet (1,000 mg total) by mouth 2 (two) times daily with a meal. -     Insulin Glargine (BASAGLAR KWIKPEN) 100 UNIT/ML SOPN; Inject 0.16 mLs (16 Units total) into the skin daily. -     lisinopril (ZESTRIL) 2.5 MG tablet; Take 1 tablet (2.5 mg total) by mouth daily. -     pravastatin (PRAVACHOL) 20 MG tablet; Take 1 tablet (20 mg total) by mouth daily.    Outpatient Encounter Medications as of 03/12/2019  Medication Sig  . glimepiride (AMARYL) 4 MG tablet Take 0.5-1 tablets (2-4 mg total) by mouth daily before breakfast. Take 2 mg daily with breakfast. Increase to 4 mg if your blood sugars are consistently running above 250 mg/dL  . [DISCONTINUED]  metFORMIN (GLUCOPHAGE) 500 MG tablet Take 2 tablets (1,000 mg total) by mouth 2 (two) times daily with a meal.  . Insulin Glargine (BASAGLAR KWIKPEN) 100 UNIT/ML SOPN Inject 0.16 mLs (16 Units total) into the skin daily.  Marland Kitchen lisinopril (ZESTRIL) 2.5 MG tablet Take 1 tablet (2.5 mg total) by mouth daily.  . metFORMIN (GLUCOPHAGE) 1000 MG tablet Take 1 tablet (1,000  mg total) by mouth 2 (two) times daily with a meal.  . nystatin (MYCOSTATIN) 100000 UNIT/ML suspension Take 5 mLs (500,000 Units total) by mouth 4 (four) times daily. Swish and spit (Patient not taking: Reported on 02/27/2019)  . pravastatin (PRAVACHOL) 20 MG tablet Take 1 tablet (20 mg total) by mouth daily.   No facility-administered encounter medications on file as of 03/12/2019.    Follow-up: Return in about 3 months (around 06/10/2019) for DM.   Kerin Perna, NP

## 2019-03-13 LAB — LIPID PANEL
Chol/HDL Ratio: 3.7 ratio (ref 0.0–5.0)
Cholesterol, Total: 146 mg/dL (ref 100–199)
HDL: 40 mg/dL (ref >39)
LDL Chol Calc (NIH): 90 mg/dL (ref 0–99)
Triglycerides: 85 mg/dL (ref 0–149)
VLDL Cholesterol Cal: 16 mg/dL (ref 5–40)

## 2019-03-13 LAB — HIV ANTIBODY (ROUTINE TESTING W REFLEX): HIV Screen 4th Generation wRfx: NONREACTIVE

## 2019-03-13 LAB — MICROALBUMIN, URINE: Microalbumin, Urine: 4.5 ug/mL

## 2019-03-15 ENCOUNTER — Telehealth (INDEPENDENT_AMBULATORY_CARE_PROVIDER_SITE_OTHER): Payer: Self-pay

## 2019-03-15 MED ORDER — GLIMEPIRIDE 4 MG PO TABS: 2.0000 mg | ORAL_TABLET | Freq: Every day | ORAL | 0 refills | Status: DC

## 2019-03-15 MED ORDER — METFORMIN HCL 1000 MG PO TABS: 1000.0000 mg | ORAL_TABLET | Freq: Two times a day (BID) | ORAL | 1 refills | Status: AC

## 2019-03-15 MED ORDER — BASAGLAR KWIKPEN 100 UNIT/ML ~~LOC~~ SOPN: 16.0000 [IU] | PEN_INJECTOR | Freq: Every day | SUBCUTANEOUS | 1 refills | Status: AC

## 2019-03-15 MED ORDER — GLIMEPIRIDE 4 MG PO TABS: 2.0000 mg | ORAL_TABLET | Freq: Every day | ORAL | 0 refills | Status: AC

## 2019-03-15 MED ORDER — PRAVASTATIN SODIUM 20 MG PO TABS: 20.0000 mg | ORAL_TABLET | Freq: Every day | ORAL | 1 refills | Status: AC

## 2019-03-15 MED ORDER — LISINOPRIL 2.5 MG PO TABS: 2.5000 mg | ORAL_TABLET | Freq: Every day | ORAL | 1 refills | Status: AC

## 2019-03-15 MED FILL — PRAVASTATIN SODIUM 20 MG TA: 20 | 30 days supply | Qty: 30 | Fill #0

## 2019-03-15 MED FILL — metFORMIN HCL 1000 MG TABS: 1000 | 30 days supply | Qty: 60 | Fill #0

## 2019-03-15 MED FILL — LISINOPRIL 2.5 MG TABLET: 2.5 | 30 days supply | Qty: 30 | Fill #0

## 2019-03-15 MED FILL — !LANTUS SOLOSTAR 100UNITS/M: 100 | 18 days supply | Qty: 3 | Fill #0

## 2019-03-15 NOTE — Telephone Encounter (Signed)
-----   Message from Grayce Sessions, NP sent at 03/15/2019  9:23 AM EST ----- Labs are  normal. HIV negative

## 2019-03-15 NOTE — Addendum Note (Signed)
Addended by: Maryjean Morn on: 03/15/2019 11:56 AM   Modules accepted: Orders

## 2019-03-15 NOTE — Telephone Encounter (Signed)
Patient is aware that labs are normal and HIV is negative. He is unable to afford medications at [facility]. He would like medications sent to [facility] pharmacy. CMA sent medications.

## 2019-09-19 ENCOUNTER — Encounter (HOSPITAL_COMMUNITY): Payer: Self-pay

## 2019-09-19 ENCOUNTER — Other Ambulatory Visit: Payer: Self-pay

## 2019-09-19 ENCOUNTER — Ambulatory Visit (HOSPITAL_COMMUNITY)
Admission: EM | Admit: 2019-09-19 | Discharge: 2019-09-19 | Disposition: A | Discharge status: Home / Self Care | Payer: Self-pay | Attending: Emergency Medicine | Admitting: Emergency Medicine

## 2019-09-19 DIAGNOSIS — Z113 Encounter for screening for infections with a predominantly sexual mode of transmission: Secondary | ICD-10-CM

## 2019-09-19 DIAGNOSIS — Z202 Contact with and (suspected) exposure to infections with a predominantly sexual mode of transmission: Secondary | ICD-10-CM

## 2019-09-19 HISTORY — DX: Essential (primary) hypertension: I10

## 2019-09-19 HISTORY — DX: Hyperlipidemia, unspecified: E78.5

## 2019-09-19 MED ORDER — METRONIDAZOLE 500 MG PO TABS
ORAL_TABLET | ORAL | Status: AC
  Filled 2019-09-19: qty 4

## 2019-09-19 MED ORDER — METRONIDAZOLE 500 MG PO TABS
2000.0000 mg | ORAL_TABLET | Freq: Once | ORAL | Status: AC
  Administered 2019-09-19: 2000 mg via ORAL

## 2019-09-19 NOTE — Discharge Instructions (Signed)
We have treated you today for trichomonas, with flagyl. Please refrain from sexual activity for 7 days while medicine is clearing infection.  We are testing you for Gonorrhea, Chlamydia and Trichomonas. We will call you if anything is positive and let you know if you require any further treatment. Please inform partner of any positive results.  Please return if symptoms not improving with treatment, development of fever, nausea, vomiting, abdominal pain, scrotal pain. 

## 2019-09-19 NOTE — ED Triage Notes (Signed)
Patient presents today for STD screening for possible exposure to Trichomonas by his ex girlfriend. Patient states he was advised last week by ex girlfriend - she tested positive for STD. Patient states he has not noticed any symptoms.

## 2019-09-20 LAB — CYTOLOGY, (ORAL, ANAL, URETHRAL) ANCILLARY ONLY
Chlamydia: NEGATIVE
Comment: NEGATIVE
Comment: NEGATIVE
Comment: NORMAL
Neisseria Gonorrhea: NEGATIVE
Trichomonas: NEGATIVE

## 2019-09-20 NOTE — ED Provider Notes (Signed)
[facility]    CSN: [id number] Arrival date & time: 09/19/19  1216      History   Chief Complaint Chief Complaint  Patient presents with  . Exposure to STD    HPI Jerome Hill is a 29 y.o. male history of hypertension, DM type II, presenting today for STD screening.  Patient reports that his ex-girlfriend recently tested positive for trichomonas.  Last intercourse with her was approximately 4 to 6 weeks ago.  He denies any symptoms of dysuria, penile discharge, denies rashes or lesions.  Denies testicle pain or swelling.  HPI  Past Medical History:  Diagnosis Date  . Diabetes mellitus without complication (HCC)   . Hyperlipidemia   . Hypertension     There are no problems to display for this patient.   History reviewed. No pertinent surgical history.     Home Medications    Prior to Admission medications   Medication Sig Start Date End Date Taking? Authorizing Provider  glimepiride (AMARYL) 4 MG tablet Take 0.5-1 tablets (2-4 mg total) by mouth daily before breakfast. Take 2 mg daily with breakfast. Increase to 4 mg if your blood sugars are consistently running above 250 mg/dL 2/0/60   Grayce Sessions, NP  Insulin Glargine (BASAGLAR KWIKPEN) 100 UNIT/ML SOPN Inject 0.16 mLs (16 Units total) into the skin daily. 03/15/19   Grayce Sessions, NP  lisinopril (ZESTRIL) 2.5 MG tablet Take 1 tablet (2.5 mg total) by mouth daily. 03/15/19   Grayce Sessions, NP  metFORMIN (GLUCOPHAGE) 1000 MG tablet Take 1 tablet (1,000 mg total) by mouth 2 (two) times daily with a meal. 03/15/19   Grayce Sessions, NP  nystatin (MYCOSTATIN) 100000 UNIT/ML suspension Take 5 mLs (500,000 Units total) by mouth 4 (four) times daily. Swish and spit Patient not taking: Reported on 02/27/2019 02/17/19   Linus Mako B, NP  pravastatin (PRAVACHOL) 20 MG tablet Take 1 tablet (20 mg total) by mouth daily. 03/15/19   Grayce Sessions, NP    Family History Family History    Family history unknown: Yes    Social History Social History   Tobacco Use  . Smoking status: Current Every Day Smoker    Types: Cigars  . Smokeless tobacco: Never Used  Vaping Use  . Vaping Use: Never used  Substance Use Topics  . Alcohol use: No  . Drug use: Yes    Types: Marijuana    Comment: occ     Allergies   Patient has no known allergies.   Review of Systems Review of Systems  Constitutional: Negative for fever.  HENT: Negative for sore throat.   Respiratory: Negative for shortness of breath.   Cardiovascular: Negative for chest pain.  Gastrointestinal: Negative for abdominal pain, nausea and vomiting.  Genitourinary: Negative for difficulty urinating, discharge, dysuria, frequency, penile pain, penile swelling, scrotal swelling and testicular pain.  Skin: Negative for rash.  Neurological: Negative for dizziness, light-headedness and headaches.     Physical Exam Triage Vital Signs ED Triage Vitals  Enc Vitals Group     BP 09/19/19 1325 131/89     Pulse Rate 09/19/19 1325 81     Resp 09/19/19 1325 16     Temp 09/19/19 1325 98.2 F (36.8 C)     Temp Source 09/19/19 1325 Oral     SpO2 09/19/19 1325 100 %     Weight --      Height --      Head Circumference --  Peak Flow --      Pain Score 09/19/19 1327 0     Pain Loc --      Pain Edu? --      Excl. in GC? --    No data found.  Updated Vital Signs BP 131/89 (BP Location: Right Arm)   Pulse 81   Temp 98.2 F (36.8 C) (Oral)   Resp 16   SpO2 100%   Visual Acuity Right Eye Distance:   Left Eye Distance:   Bilateral Distance:    Right Eye Near:   Left Eye Near:    Bilateral Near:     Physical Exam Vitals and nursing note reviewed.  Constitutional:      Appearance: He is well-developed.     Comments: No acute distress  HENT:     Head: Normocephalic and atraumatic.     Nose: Nose normal.  Eyes:     Conjunctiva/sclera: Conjunctivae normal.  Cardiovascular:     Rate and Rhythm:  Normal rate.  Pulmonary:     Effort: Pulmonary effort is normal. No respiratory distress.  Abdominal:     General: There is no distension.  Musculoskeletal:        General: Normal range of motion.     Cervical back: Neck supple.  Skin:    General: Skin is warm and dry.  Neurological:     Mental Status: He is alert and oriented to person, place, and time.      UC Treatments / Results  Labs (all labs ordered are listed, but only abnormal results are displayed) Labs Reviewed  CYTOLOGY, (ORAL, ANAL, URETHRAL) ANCILLARY ONLY    EKG   Radiology No results found.  Procedures Procedures (including critical care time)  Medications Ordered in UC Medications  metroNIDAZOLE (FLAGYL) tablet 2,000 mg (2,000 mg Oral Given 09/19/19 1409)    Initial Impression / Assessment and Plan / UC Course  I have reviewed the triage vital signs and the nursing notes.  Pertinent labs & imaging results that were available during my care of the patient were reviewed by me and considered in my medical decision making (see chart for details).     Exposure to trichomonas, treating with 2 g Flagyl.  Currently asymptomatic.  Urethral swab pending.  Will call with results and alter treatment as needed.Discussed strict return precautions. Patient verbalized understanding and is agreeable with plan.  Final Clinical Impressions(s) / UC Diagnoses   Final diagnoses:  Exposure to STD  Screen for STD (sexually transmitted disease)     Discharge Instructions     We have treated you today for trichomonas, with flagyl. Please refrain from sexual activity for 7 days while medicine is clearing infection.  We are testing you for Gonorrhea, Chlamydia and Trichomonas. We will call you if anything is positive and let you know if you require any further treatment. Please inform partner of any positive results.  Please return if symptoms not improving with treatment, development of fever, nausea, vomiting,  abdominal pain, scrotal pain.     ED Prescriptions    None     PDMP not reviewed this encounter.   Lew Dawes, PA-C 09/20/19 1103

## 2019-10-20 ENCOUNTER — Emergency Department (HOSPITAL_COMMUNITY)
Admission: EM | Admit: 2019-10-20 | Discharge: 2019-10-21 | Disposition: A | Discharge status: Home / Self Care | Payer: Self-pay | Attending: Emergency Medicine | Admitting: Emergency Medicine

## 2019-10-20 ENCOUNTER — Other Ambulatory Visit: Payer: Self-pay

## 2019-10-20 DIAGNOSIS — Z5321 Procedure and treatment not carried out due to patient leaving prior to being seen by health care provider: Secondary | ICD-10-CM | POA: Insufficient documentation

## 2019-10-20 DIAGNOSIS — L509 Urticaria, unspecified: Secondary | ICD-10-CM | POA: Insufficient documentation

## 2019-10-21 ENCOUNTER — Encounter (HOSPITAL_COMMUNITY): Payer: Self-pay

## 2019-10-21 NOTE — ED Triage Notes (Addendum)
Pt reports hives on his bilat arms, axilla, and legs that started 3 days ago. Reports that he has been using benadryl and getting 4-5 hours of relief, but they return. Denies known allergies or changes in daily habits.

## 2020-03-19 ENCOUNTER — Encounter (HOSPITAL_COMMUNITY): Payer: Self-pay

## 2020-03-19 ENCOUNTER — Other Ambulatory Visit: Payer: Self-pay

## 2020-03-19 ENCOUNTER — Emergency Department (HOSPITAL_COMMUNITY)
Admission: EM | Admit: 2020-03-19 | Discharge: 2020-03-20 | Disposition: A | Discharge status: Home / Self Care | Payer: Self-pay | Attending: Emergency Medicine | Admitting: Emergency Medicine

## 2020-03-19 DIAGNOSIS — E119 Type 2 diabetes mellitus without complications: Secondary | ICD-10-CM | POA: Insufficient documentation

## 2020-03-19 DIAGNOSIS — Z79899 Other long term (current) drug therapy: Secondary | ICD-10-CM | POA: Insufficient documentation

## 2020-03-19 DIAGNOSIS — K0889 Other specified disorders of teeth and supporting structures: Secondary | ICD-10-CM | POA: Insufficient documentation

## 2020-03-19 DIAGNOSIS — I10 Essential (primary) hypertension: Secondary | ICD-10-CM | POA: Insufficient documentation

## 2020-03-19 DIAGNOSIS — Z7984 Long term (current) use of oral hypoglycemic drugs: Secondary | ICD-10-CM | POA: Insufficient documentation

## 2020-03-19 DIAGNOSIS — Z794 Long term (current) use of insulin: Secondary | ICD-10-CM | POA: Insufficient documentation

## 2020-03-19 DIAGNOSIS — F17290 Nicotine dependence, other tobacco product, uncomplicated: Secondary | ICD-10-CM | POA: Insufficient documentation

## 2020-03-19 NOTE — ED Triage Notes (Signed)
Pt reports that he has a broken tooth on top L side, went to dentist today, placed on antibiotic and has date set to pull.

## 2020-03-20 MED ORDER — LIDOCAINE VISCOUS HCL 2 % MT SOLN
15.0000 mL | Freq: Once | OROMUCOSAL | Status: AC
  Administered 2020-03-20: 15 mL via OROMUCOSAL
  Filled 2020-03-20: qty 15

## 2020-03-20 MED ORDER — LIDOCAINE VISCOUS HCL 2 % MT SOLN: 15.0000 mL | OROMUCOSAL | 0 refills | Status: DC | PRN

## 2020-03-20 MED ORDER — KETOROLAC TROMETHAMINE 60 MG/2ML IM SOLN
30.0000 mg | Freq: Once | INTRAMUSCULAR | Status: AC
  Administered 2020-03-20: 30 mg via INTRAMUSCULAR
  Filled 2020-03-20: qty 2

## 2020-03-20 NOTE — ED Provider Notes (Signed)
[facility] EMERGENCY DEPARTMENT Provider Note   CSN: [id number] Arrival date & time: 03/19/20  2037     History Chief Complaint  Patient presents with  . Dental Pain    Kou A Luera is a 30 y.o. male with a history of diabetes mellitus, HLD, and HTN who presents to the emergency department with a chief complaint of dental pain.  Patient reports that he has been having left upper dental pain that has been constant for the last 3 days.  Pain has been gradually worsening since onset.  He was seen earlier today by his dentist who started him on an antibiotic and discharged him with ibuprofen.  He plans to have the tooth pulled in the next 2 weeks.  After the visit today, he reported that the pain suddenly worsened.  He characterizes the pain as sharp.  No known aggravating or alleviating factors.  He does report that he took a dose of Aleve much earlier in the day and once attempted to take ibuprofen approximately 20 to 30 minutes prior to arrival.  Pain began to significantly improve while he was waiting in the ER from 10/10 to 5/10 with ibuprofen.  He denies any new dental injuries, facial swelling, trismus, muffled voice, fever, chills, dysphagia.  No recent polyuria or polydipsia.  He has been compliant with his home medications.  The history is provided by the patient. No language interpreter was used.       Past Medical History:  Diagnosis Date  . Diabetes mellitus without complication (HCC)   . Hyperlipidemia   . Hypertension     There are no problems to display for this patient.   History reviewed. No pertinent surgical history.     Family History  Family history unknown: Yes    Social History   Tobacco Use  . Smoking status: Current Every Day Smoker    Types: Cigars  . Smokeless tobacco: Never Used  Vaping Use  . Vaping Use: Never used  Substance Use Topics  . Alcohol use: No  . Drug use: Yes    Types: Marijuana    Comment: occ     Home Medications Prior to Admission medications   Medication Sig Start Date End Date Taking? Authorizing Provider  lidocaine (XYLOCAINE) 2 % solution Use as directed 15 mLs in the mouth or throat every 3 (three) hours as needed for mouth pain. 03/20/20  Yes Doreena Maulden A, PA-C  glimepiride (AMARYL) 4 MG tablet Take 0.5-1 tablets (2-4 mg total) by mouth daily before breakfast. Take 2 mg daily with breakfast. Increase to 4 mg if your blood sugars are consistently running above 250 mg/dL 0/5/39   Grayce Sessions, NP  Insulin Glargine (BASAGLAR KWIKPEN) 100 UNIT/ML SOPN Inject 0.16 mLs (16 Units total) into the skin daily. 03/15/19   Grayce Sessions, NP  lisinopril (ZESTRIL) 2.5 MG tablet Take 1 tablet (2.5 mg total) by mouth daily. 03/15/19   Grayce Sessions, NP  metFORMIN (GLUCOPHAGE) 1000 MG tablet Take 1 tablet (1,000 mg total) by mouth 2 (two) times daily with a meal. 03/15/19   Grayce Sessions, NP  nystatin (MYCOSTATIN) 100000 UNIT/ML suspension Take 5 mLs (500,000 Units total) by mouth 4 (four) times daily. Swish and spit Patient not taking: Reported on 02/27/2019 02/17/19   Linus Mako B, NP  pravastatin (PRAVACHOL) 20 MG tablet Take 1 tablet (20 mg total) by mouth daily. 03/15/19   Grayce Sessions, NP    Allergies  Patient has no known allergies.  Review of Systems   Review of Systems  Constitutional: Negative for appetite change, chills and fever.  HENT: Positive for dental problem. Negative for facial swelling and trouble swallowing.   Respiratory: Negative for shortness of breath.   Cardiovascular: Negative for chest pain.  Gastrointestinal: Negative for abdominal pain and vomiting.  Genitourinary: Negative for dysuria.  Musculoskeletal: Negative for back pain.  Skin: Negative for rash.  Allergic/Immunologic: Negative for immunocompromised state.  Neurological: Negative for headaches.  Psychiatric/Behavioral: Negative for confusion.    Physical  Exam Updated Vital Signs BP 125/79   Pulse 80   Temp 98.3 F (36.8 C) (Oral)   Resp 16   SpO2 100%   Physical Exam Vitals and nursing note reviewed.  Constitutional:      Appearance: He is well-developed.  HENT:     Head: Normocephalic and atraumatic.     Jaw: There is normal jaw occlusion. No trismus, tenderness, swelling or pain on movement.     Mouth/Throat:     Mouth: Mucous membranes are moist. No lacerations or angioedema.     Dentition: Abnormal dentition. Dental tenderness present. No gingival swelling, dental caries, dental abscesses or gum lesions.     Pharynx: Oropharynx is clear. Uvula midline. No pharyngeal swelling, oropharyngeal exudate, posterior oropharyngeal erythema or uvula swelling.      Comments: No sublingual edema.  Tender to palpation to the left maxillary first molar.  No dental abscess. Eyes:     Conjunctiva/sclera: Conjunctivae normal.  Cardiovascular:     Rate and Rhythm: Normal rate and regular rhythm.     Heart sounds: No murmur heard.   Pulmonary:     Effort: Pulmonary effort is normal. No respiratory distress.     Breath sounds: No stridor. No wheezing, rhonchi or rales.  Chest:     Chest wall: No tenderness.  Abdominal:     General: There is no distension.     Palpations: Abdomen is soft.  Musculoskeletal:     Cervical back: Neck supple.  Skin:    General: Skin is warm and dry.  Neurological:     Mental Status: He is alert.  Psychiatric:        Behavior: Behavior normal.     ED Results / Procedures / Treatments   Labs (all labs ordered are listed, but only abnormal results are displayed) Labs Reviewed - No data to display  EKG None  Radiology No results found.  Procedures Procedures (including critical care time)  Medications Ordered in ED Medications  ketorolac (TORADOL) injection 30 mg (30 mg Intramuscular Given 03/20/20 0243)  lidocaine (XYLOCAINE) 2 % viscous mouth solution 15 mL (15 mLs Mouth/Throat Given 03/20/20  0244)    ED Course  I have reviewed the triage vital signs and the nursing notes.  Pertinent labs & imaging results that were available during my care of the patient were reviewed by me and considered in my medical decision making (see chart for details).    MDM Rules/Calculators/A&P                          30 year old male with history of diabetes mellitus, HLD, and HTN who presents the emergency department with a chief complaint of dental pain.  Patient symptoms of pain for the last 3 days that acutely worsened just prior to arrival.  He was seen by his dentist earlier today and started on antibiotics with a plan to pull the  tooth in the next 2 weeks.  No constitutional symptoms.  No dental abscess.  No evidence of Ludewig's angina.  It has been more than 6 hours since patient's last dose of anti-inflammatory medication and Toradol will be given in the ER since patient denies any history of kidney problems.  We will also give viscous lidocaine for pain control.  He has been advised to follow-up with his dentist.  ER return precautions given.  He is hemodynamically stable in no acute distress.  Safer discharge home with outpatient follow-up dedicated.  Final Clinical Impression(s) / ED Diagnoses Final diagnoses:  Dentalgia    Rx / DC Orders ED Discharge Orders         Ordered    lidocaine (XYLOCAINE) 2 % solution  Every  3 hours PRN        03/20/20 0240           Barkley Boards, PA-C 03/20/20 0255    Zadie Rhine, MD 03/21/20 0023

## 2020-03-20 NOTE — Discharge Instructions (Signed)
Thank you for allowing me to care for you today in the Emergency Department.   Take 650 mg of Tylenol or 600 mg of ibuprofen with food every 6 hours for pain.  You can alternate between these 2 medications every 3 hours if your pain returns.  For instance, you can take Tylenol at noon, followed by a dose of ibuprofen at 3, followed by second dose of Tylenol and 6.  Not also take Aleve if you are taking ibuprofen as both of these medications are considered NSAIDs.   You can use 15 mL of viscous lidocaine every 3 hours as needed for pain.  Please follow closely with your dentist.  Continue to take the antibiotics as prescribed.  Return to the emergency department if you become unable to swallow, if you have drooling, fever, unable to open your mouth, start having significant swelling of the face or neck, especially after you have been on antibiotics for 72 hours, or have new or other concerning symptoms.

## 2020-03-20 NOTE — ED Notes (Signed)
Pt d/c by MD and is provided w/ d/c instructions and follow up care, Pt is out of the ED ambulatory by self  

## 2020-03-20 NOTE — ED Notes (Signed)
LWBS 

## 2021-02-18 ENCOUNTER — Other Ambulatory Visit: Payer: Self-pay

## 2021-02-18 ENCOUNTER — Encounter (HOSPITAL_COMMUNITY): Payer: Self-pay

## 2021-02-18 ENCOUNTER — Ambulatory Visit (HOSPITAL_COMMUNITY)
Admission: EM | Admit: 2021-02-18 | Discharge: 2021-02-18 | Disposition: A | Discharge status: Home / Self Care | Payer: Self-pay | Attending: Family Medicine | Admitting: Family Medicine

## 2021-02-18 DIAGNOSIS — K029 Dental caries, unspecified: Secondary | ICD-10-CM

## 2021-02-18 DIAGNOSIS — K047 Periapical abscess without sinus: Secondary | ICD-10-CM

## 2021-02-18 MED ORDER — CLINDAMYCIN HCL 300 MG PO CAPS: 300.0000 mg | ORAL_CAPSULE | Freq: Three times a day (TID) | ORAL | 0 refills | Status: AC

## 2021-02-18 MED ORDER — CHLORHEXIDINE GLUCONATE 0.12 % MT SOLN: 15.0000 mL | Freq: Two times a day (BID) | OROMUCOSAL | 0 refills | Status: AC

## 2021-02-18 MED ORDER — IBUPROFEN 800 MG PO TABS: 800.0000 mg | ORAL_TABLET | Freq: Three times a day (TID) | ORAL | 0 refills | Status: DC

## 2021-02-18 NOTE — ED Provider Notes (Signed)
[facility]    CSN: [id number] Arrival date & time: 02/18/21  1231      History   Chief Complaint Chief Complaint  Patient presents with   Dental Pain    HPI Jerome Hill is a 30 y.o. male.   HPI Patient with a medical history of DM-2 and Hypertension presents for evaluation of dental pain involving multiple teeth, broken teeth, and gingival swelling and tenderness.  This is been a recurrent problem for some time.  Patient was seen at the ER earlier this year for the same problem. He has not seen a dentist for this problem. He denies fever or facial swelling. He is a current smoker. Past Medical History:  Diagnosis Date   Diabetes mellitus without complication (HCC)    Hyperlipidemia    Hypertension     There are no problems to display for this patient.   History reviewed. No pertinent surgical history.     Home Medications    Prior to Admission medications   Medication Sig Start Date End Date Taking? Authorizing Provider  chlorhexidine (PERIDEX) 0.12 % solution Use as directed 15 mLs in the mouth or throat 2 (two) times daily for 7 days. 02/18/21 02/25/21 Yes Bing Neighbors, FNP  clindamycin (CLEOCIN) 300 MG capsule Take 1 capsule (300 mg total) by mouth 3 (three) times daily for 7 days. 02/18/21 02/25/21 Yes Bing Neighbors, FNP  ibuprofen (ADVIL) 800 MG tablet Take 1 tablet (800 mg total) by mouth 3 (three) times daily. 02/18/21  Yes Bing Neighbors, FNP  glimepiride (AMARYL) 4 MG tablet Take 0.5-1 tablets (2-4 mg total) by mouth daily before breakfast. Take 2 mg daily with breakfast. Increase to 4 mg if your blood sugars are consistently running above 250 mg/dL 08/08/76   Grayce Sessions, NP  Insulin Glargine (BASAGLAR KWIKPEN) 100 UNIT/ML SOPN Inject 0.16 mLs (16 Units total) into the skin daily. 03/15/19   Grayce Sessions, NP  lidocaine (XYLOCAINE) 2 % solution Use as directed 15 mLs in the mouth or throat every 3 (three) hours as needed  for mouth pain. 03/20/20   McDonald, Mia A, PA-C  lisinopril (ZESTRIL) 2.5 MG tablet Take 1 tablet (2.5 mg total) by mouth daily. 03/15/19   Grayce Sessions, NP  metFORMIN (GLUCOPHAGE) 1000 MG tablet Take 1 tablet (1,000 mg total) by mouth 2 (two) times daily with a meal. 03/15/19   Grayce Sessions, NP  nystatin (MYCOSTATIN) 100000 UNIT/ML suspension Take 5 mLs (500,000 Units total) by mouth 4 (four) times daily. Swish and spit Patient not taking: Reported on 02/27/2019 02/17/19   Linus Mako B, NP  pravastatin (PRAVACHOL) 20 MG tablet Take 1 tablet (20 mg total) by mouth daily. 03/15/19   Grayce Sessions, NP    Family History Family History  Family history unknown: Yes    Social History Social History   Tobacco Use   Smoking status: Every Day    Types: Cigars   Smokeless tobacco: Never  Vaping Use   Vaping Use: Never used  Substance Use Topics   Alcohol use: No   Drug use: Yes    Types: Marijuana    Comment: occ     Allergies   Patient has no known allergies.   Review of Systems Review of Systems Pertinent negatives listed in HPI  Physical Exam Triage Vital Signs ED Triage Vitals [02/18/21 1323]  Enc Vitals Group     BP 136/74     Pulse Rate  82     Resp 18     Temp 98.1 F (36.7 C)     Temp Source Oral     SpO2 99 %     Weight      Height      Head Circumference      Peak Flow      Pain Score 9     Pain Loc      Pain Edu?      Excl. in GC?    No data found.  Updated Vital Signs BP 136/74 (BP Location: Left Arm)    Pulse 82    Temp 98.1 F (36.7 C) (Oral)    Resp 18    SpO2 99%   Visual Acuity Right Eye Distance:   Left Eye Distance:   Bilateral Distance:    Right Eye Near:   Left Eye Near:    Bilateral Near:     Physical Exam Constitutional:      Appearance: He is obese.  HENT:     Head: Normocephalic and atraumatic.     Mouth/Throat:     Dentition: Abnormal dentition. Gingival swelling and dental caries present.   Cardiovascular:     Rate and Rhythm: Normal rate and regular rhythm.  Pulmonary:     Effort: Pulmonary effort is normal.     Breath sounds: Normal breath sounds.  Lymphadenopathy:     Cervical: No cervical adenopathy.  Skin:    General: Skin is warm.     Capillary Refill: Capillary refill takes less than 2 seconds.  Neurological:     Mental Status: He is alert.  Psychiatric:        Attention and Perception: Attention normal.        Mood and Affect: Mood normal.        Speech: Speech normal.        Behavior: Behavior normal.   UC Treatments / Results  Labs (all labs ordered are listed, but only abnormal results are displayed) Labs Reviewed - No data to display  EKG   Radiology No results found.  Procedures Procedures (including critical care time)  Medications Ordered in UC Medications - No data to display  Initial Impression / Assessment and Plan / UC Course  I have reviewed the triage vital signs and the nursing notes.  Pertinent labs & imaging results that were available during my care of the patient were reviewed by me and considered in my medical decision making (see chart for details).    Dental caries and dental infection  No evidence of facial cellulitis. We will treat today with clindamycin for dental infection, recommended Peridex twice daily for 7 days as an oral cleanse, and ibuprofen 800 mg 3 times daily as needed for pain. Patient will ultimately need evaluation to have teeth removed. Return precautions if symptoms worsen or do not readily improve. Final Clinical Impressions(s) / UC Diagnoses   Final diagnoses:  Dental caries  Dental infection   Discharge Instructions   None    ED Prescriptions     Medication Sig Dispense Auth. Provider   clindamycin (CLEOCIN) 300 MG capsule Take 1 capsule (300 mg total) by mouth 3 (three) times daily for 7 days. 21 capsule Bing Neighbors, FNP   chlorhexidine (PERIDEX) 0.12 % solution Use as directed 15  mLs in the mouth or throat 2 (two) times daily for 7 days. 210 mL Bing Neighbors, FNP   ibuprofen (ADVIL) 800 MG tablet Take 1 tablet (800 mg  total) by mouth 3 (three) times daily. 21 tablet Bing Neighbors, FNP      PDMP not reviewed this encounter.   Bing Neighbors, FNP 02/18/21 1340

## 2021-02-18 NOTE — ED Triage Notes (Signed)
Pt c/o dental pain to multiple areas in his mouth for over a week.

## 2021-02-19 ENCOUNTER — Emergency Department (HOSPITAL_COMMUNITY)
Admission: EM | Admit: 2021-02-19 | Discharge: 2021-02-19 | Disposition: A | Discharge status: Home / Self Care | Payer: Self-pay | Attending: Student | Admitting: Student

## 2021-02-19 ENCOUNTER — Other Ambulatory Visit: Payer: Self-pay

## 2021-02-19 DIAGNOSIS — Z794 Long term (current) use of insulin: Secondary | ICD-10-CM | POA: Insufficient documentation

## 2021-02-19 DIAGNOSIS — E119 Type 2 diabetes mellitus without complications: Secondary | ICD-10-CM | POA: Insufficient documentation

## 2021-02-19 DIAGNOSIS — Z79899 Other long term (current) drug therapy: Secondary | ICD-10-CM | POA: Insufficient documentation

## 2021-02-19 DIAGNOSIS — Z7984 Long term (current) use of oral hypoglycemic drugs: Secondary | ICD-10-CM | POA: Insufficient documentation

## 2021-02-19 DIAGNOSIS — F17290 Nicotine dependence, other tobacco product, uncomplicated: Secondary | ICD-10-CM | POA: Insufficient documentation

## 2021-02-19 DIAGNOSIS — I10 Essential (primary) hypertension: Secondary | ICD-10-CM | POA: Insufficient documentation

## 2021-02-19 DIAGNOSIS — K047 Periapical abscess without sinus: Secondary | ICD-10-CM | POA: Insufficient documentation

## 2021-02-19 MED ORDER — HYDROCODONE-ACETAMINOPHEN 5-325 MG PO TABS
1.0000 | ORAL_TABLET | Freq: Once | ORAL | Status: AC
  Administered 2021-02-19: 1 via ORAL
  Filled 2021-02-19: qty 1

## 2021-02-19 NOTE — Discharge Instructions (Signed)
Continue taking the antibiotics you have been prescribed.  Use Orajel and the Peridex/chlorhexidine mouth rinse that you were prescribed at urgent care.  Please use Tylenol or ibuprofen for pain.  You may use 600 mg ibuprofen every 6 hours or 1000 mg of Tylenol every 6 hours.  You may choose to alternate between the 2.  This would be most effective.  Not to exceed 4 g of Tylenol within 24 hours.  Not to exceed 3200 mg ibuprofen 24 hours.   Call today to make an appointment with the dentist I have given you their information.

## 2021-02-19 NOTE — ED Notes (Signed)
Called pt for triage, no answer 

## 2021-02-19 NOTE — ED Notes (Signed)
No answer for triage x3 

## 2021-02-19 NOTE — ED Provider Notes (Signed)
[facility] EMERGENCY DEPARTMENT Provider Note   CSN: [id number] Arrival date & time: 02/19/21  1884     History   Chief Complaint Chief Complaint  Patient presents with   Dental Pain    HPI Jerome Hill is a 30 y.o. male.  Patient presents to the emergency department with a dental complaint. Symptoms began 3 days ago. The patient has tried to alleviate pain with ibuprofen.  Pain rated as severe, characterized as throbbing in nature and located RL dentition. Patient denies fever, night sweats, chills, difficulty swallowing or opening mouth, SOB, nuchal rigidity or decreased ROM of neck.  Patient does not have a dentist and requests a resource guide at discharge.  Patient is a 30 year old male presenting the emergency room today with complaints of right lower dental pain.  Has been ongoing for approximately 3 days he states that it is achy severe constant.  He was seen in urgent care yesterday and prescribed clindamycin, Peridex oral rinse, 800 mg ibuprofens.  States that he has been taking his medications as directed but has continued pain.  States that he does not have a dentist to follow-up with.    HPI  Past Medical History:  Diagnosis Date   Diabetes mellitus without complication (HCC)    Hyperlipidemia    Hypertension     There are no problems to display for this patient.   No past surgical history on file.      Home Medications    Prior to Admission medications   Medication Sig Start Date End Date Taking? Authorizing Provider  chlorhexidine (PERIDEX) 0.12 % solution Use as directed 15 mLs in the mouth or throat 2 (two) times daily for 7 days. 02/18/21 02/25/21  Bing Neighbors, FNP  clindamycin (CLEOCIN) 300 MG capsule Take 1 capsule (300 mg total) by mouth 3 (three) times daily for 7 days. 02/18/21 02/25/21  Bing Neighbors, FNP  glimepiride (AMARYL) 4 MG tablet Take 0.5-1 tablets (2-4 mg total) by mouth daily before breakfast. Take 2  mg daily with breakfast. Increase to 4 mg if your blood sugars are consistently running above 250 mg/dL 03/13/58   Grayce Sessions, NP  ibuprofen (ADVIL) 800 MG tablet Take 1 tablet (800 mg total) by mouth 3 (three) times daily. 02/18/21   Bing Neighbors, FNP  Insulin Glargine (BASAGLAR KWIKPEN) 100 UNIT/ML SOPN Inject 0.16 mLs (16 Units total) into the skin daily. 03/15/19   Grayce Sessions, NP  lidocaine (XYLOCAINE) 2 % solution Use as directed 15 mLs in the mouth or throat every 3 (three) hours as needed for mouth pain. 03/20/20   McDonald, Mia A, PA-C  lisinopril (ZESTRIL) 2.5 MG tablet Take 1 tablet (2.5 mg total) by mouth daily. 03/15/19   Grayce Sessions, NP  metFORMIN (GLUCOPHAGE) 1000 MG tablet Take 1 tablet (1,000 mg total) by mouth 2 (two) times daily with a meal. 03/15/19   Grayce Sessions, NP  nystatin (MYCOSTATIN) 100000 UNIT/ML suspension Take 5 mLs (500,000 Units total) by mouth 4 (four) times daily. Swish and spit Patient not taking: Reported on 02/27/2019 02/17/19   Linus Mako B, NP  pravastatin (PRAVACHOL) 20 MG tablet Take 1 tablet (20 mg total) by mouth daily. 03/15/19   Grayce Sessions, NP    Family History Family History  Family history unknown: Yes    Social History Social History   Tobacco Use   Smoking status: Every Day    Types: Cigars   Smokeless  tobacco: Never  Vaping Use   Vaping Use: Never used  Substance Use Topics   Alcohol use: No   Drug use: Yes    Types: Marijuana    Comment: occ     Allergies   Patient has no known allergies.   Review of Systems Denies fevers, chills, difficulty swallowing or eating, changes in voice, pain under tongue, nausea, vomiting, lightheadedness or dizziness. No trismus   Physical Exam Updated Vital Signs BP (!) 163/102    Pulse 79    Temp 98.3 F (36.8 C) (Oral)    SpO2 97%   Physical Exam Physical Exam  Constitutional: Pt appears well-developed and well-nourished.  HENT:  Head:  Normocephalic.  Right Ear: Tympanic membrane, external ear and ear canal normal.  Left Ear: Tympanic membrane, external ear and ear canal normal.  Nose: Nose normal. Right sinus exhibits no maxillary sinus tenderness and no frontal sinus tenderness. Left sinus exhibits no maxillary sinus tenderness and no frontal sinus tenderness.  Mouth/Throat: Uvula is midline, oropharynx is clear and moist and mucous membranes are normal. No oral lesions. No uvula swelling or lacerations. No oropharyngeal exudate, posterior oropharyngeal edema, posterior oropharyngeal erythema or tonsillar abscesses.  Poor dentition No gingival swelling, fluctuance or induration No gross abscess  No sublingual edema, tenderness to palpation, or sign of Ludwig's angina, or deep space infection Pain at right lower dentition/gumline.  There are multiple eroded teeth flat with the gumline.  Some gingival swelling and hypertrophy.  No focal abscess/fluctuance Eyes: Conjunctivae are normal. Pupils are equal, round, and reactive to light. Right eye exhibits no discharge. Left eye exhibits no discharge.  Neck: Normal range of motion. Neck supple.  No stridor Handling secretions without difficulty No nuchal rigidity No cervical lymphadenopathy Cardiovascular: Normal rate, regular rhythm and normal heart sounds.   Pulmonary/Chest: Effort normal. No respiratory distress.  Equal chest rise  Abdominal: Soft. Bowel sounds are normal. Pt exhibits no distension. There is no tenderness.  Lymphadenopathy: Pt has no cervical adenopathy.  Neurological: Pt is alert and oriented x 4  Skin: Skin is warm and dry.  Psychiatric: Pt has a normal mood and affect.  Nursing note and vitals reviewed.   ED Treatments / Results  Labs (all labs ordered are listed, but only abnormal results are displayed) Labs Reviewed - No data to display  EKG    Radiology No results found.  Procedures Procedures (including critical care time)  Medications  Ordered in ED Medications  HYDROcodone-acetaminophen (NORCO/VICODIN) 5-325 MG per tablet 1 tablet (has no administration in time range)     Initial Impression / Assessment and Plan / ED Course  I have reviewed the triage vital signs and the nursing notes.  Pertinent labs & imaging results that were available during my care of the patient were reviewed by me and considered in my medical decision making (see chart for details).        Patient with dentalgia.  No abscess requiring immediate incision and drainage.  Exam not concerning for Ludwig's angina or pharyngeal abscess.  Pt instructed to follow-up with dentist.  Discussed return precautions. Pt safe for discharge.\   \\I provided him with specific information for Dr. Jim Like who is our on-call adult general dentist.  He will follow-up with Dr. Jim Like.  Patient is already been prescribed clindamycin which I think is reasonable for him to continue.  1 dose of analgesic 1 dose of narcotic here given patient is in severe pain.  No trismus and no significant  swelling on examination although he does have some gingival swelling.  Final Clinical Impressions(s) / ED Diagnoses   Final diagnoses:  Dental infection    ED Discharge Orders     None         Gailen Shelter, [place] 02/19/21 6295    Glendora Score, MD 02/19/21 [phone]

## 2021-02-23 ENCOUNTER — Encounter (HOSPITAL_COMMUNITY): Payer: Self-pay | Admitting: Emergency Medicine

## 2021-02-23 ENCOUNTER — Other Ambulatory Visit: Payer: Self-pay

## 2021-02-23 ENCOUNTER — Ambulatory Visit (HOSPITAL_COMMUNITY)
Admission: EM | Admit: 2021-02-23 | Discharge: 2021-02-23 | Disposition: A | Discharge status: Home / Self Care | Payer: Self-pay | Attending: Urgent Care | Admitting: Urgent Care

## 2021-02-23 DIAGNOSIS — M7918 Myalgia, other site: Secondary | ICD-10-CM

## 2021-02-23 MED ORDER — METAXALONE 800 MG PO TABS: 800.0000 mg | ORAL_TABLET | Freq: Three times a day (TID) | ORAL | 0 refills | Status: AC

## 2021-02-23 MED ORDER — DICLOFENAC SODIUM 75 MG PO TBEC: 75.0000 mg | DELAYED_RELEASE_TABLET | Freq: Two times a day (BID) | ORAL | 0 refills | Status: AC
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
## 2021-02-23 NOTE — Discharge Instructions (Addendum)
Alternate ice and heat to the affected area of pain. Take muscle relaxer as instructed, note that it might make you sleepy. Take the antiinflammatory with food as needed for pain. Head to the ER if any new symptoms develop.

## 2021-03-11 ENCOUNTER — Emergency Department (HOSPITAL_COMMUNITY): Payer: Self-pay

## 2021-03-11 ENCOUNTER — Other Ambulatory Visit: Payer: Self-pay

## 2021-03-11 ENCOUNTER — Emergency Department (HOSPITAL_COMMUNITY)
Admission: EM | Admit: 2021-03-11 | Discharge: 2021-03-12 | Disposition: A | Discharge status: Home / Self Care | Payer: Self-pay | Attending: Student | Admitting: Student

## 2021-03-11 DIAGNOSIS — M549 Dorsalgia, unspecified: Secondary | ICD-10-CM | POA: Insufficient documentation

## 2021-03-11 DIAGNOSIS — Z5321 Procedure and treatment not carried out due to patient leaving prior to being seen by health care provider: Secondary | ICD-10-CM | POA: Insufficient documentation
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
## 2021-03-11 NOTE — ED Notes (Signed)
Called for room x4 
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
## 2021-05-07 ENCOUNTER — Encounter (HOSPITAL_COMMUNITY): Payer: Self-pay | Admitting: *Deleted

## 2021-05-07 ENCOUNTER — Ambulatory Visit (HOSPITAL_COMMUNITY)
Admission: EM | Admit: 2021-05-07 | Discharge: 2021-05-07 | Disposition: A | Discharge status: Home / Self Care | Payer: Self-pay | Attending: Family Medicine | Admitting: Family Medicine

## 2021-05-07 ENCOUNTER — Other Ambulatory Visit: Payer: Self-pay

## 2021-05-07 DIAGNOSIS — L089 Local infection of the skin and subcutaneous tissue, unspecified: Secondary | ICD-10-CM

## 2021-05-07 DIAGNOSIS — B9689 Other specified bacterial agents as the cause of diseases classified elsewhere: Secondary | ICD-10-CM

## 2021-05-07 MED ORDER — DOXYCYCLINE HYCLATE 100 MG PO CAPS: 100.0000 mg | ORAL_CAPSULE | Freq: Two times a day (BID) | ORAL | 0 refills | Status: DC

## 2021-05-07 NOTE — ED Triage Notes (Signed)
Pt reports rectal  pain ,chills and night sweats. Pt reports he has a hard time sitting due to rectal pain. ?

## 2021-05-07 NOTE — Discharge Instructions (Signed)
Begin taking the antibiotic as soon as possible. Go to the Emergency Department if you are not seeing significant improvement of the pain around your rectum over the next 48 hours. ?

## 2021-05-07 NOTE — ED Provider Notes (Signed)
?[facility] ? ? ?[id number] ?05/07/21 Arrival Time: 1630 ? ?ASSESSMENT & PLAN: ? ?1. Localized bacterial skin infection   ? ?Overall he feels like the area around his rectum has improved over the past 1-2 days. No signs of an abscess at this time but feel like this localized skin infection is the cause of his elevated temperature. Discussed. ?He is comfortable starting antibiotic this evening and close home observation. Agrees to ED evaluation should his symptoms worsen in any way. Normal bowel movements without bleeding. ? ?Begin: ?Meds ordered this encounter  ?Medications  ? doxycycline (VIBRAMYCIN) 100 MG capsule  ?  Sig: Take 1 capsule (100 mg total) by mouth 2 (two) times daily.  ?  Dispense:  14 capsule  ?  Refill:  0  ? ?OTC analgesics as needed. ? ?Recommend: ? Follow-up Information   ? ? [facility] EM[facility] DEPART[facility].   ?Specialty: Emergency Medicine ?Why: If symptoms worsen in any way. ?Contact information: ?[address] ?[id number] mc ?[place] [address] ?[phone] ? ?  ?  ? ?  ?  ? ?  ? ?Unrelated, also given note stating he missed work in 02/2021 secondary to [facility]. 02/23/21 reviewed. ? ?Reviewed expectations re: course of current medical issues. Questions answered. ?Outlined signs and symptoms indicating need for more acute intervention. ?Patient verbalized understanding. ?After Visit Summary given. ? ? ?SUBJECTIVE: ?History from: patient. ? ?Jerome Hill is a 31 y.o. male with DM who presents with complaint of pain around rectum; gradual onset; x 3-4 days; slightly better over past day or two. Subj fever with chills present; mainly at night. Normal non-bloody bowel movements. Ambulatory. No abdominal pain. No tx PTA. ?Denies anal sex or rectal trauma. ?No h/o similar. Feels blood sugars are controlled. ? ? ?OBJECTIVE: ? ?Vitals:  ? 05/07/21 1800  ?BP: (!) 151/85  ?Pulse: (!) 111  ?Resp: 18  ?Temp: (!) 100.6 ?F (38.1 ?C)  ?SpO2: 98%  ?Abnormal VS  noted. ? ?General appearance: alert; no distress ?HEENT: [place]; AT ?Neck: supple with FROM; without midline tenderness ?Lungs: unlabored respirations; speaks full sentences without difficulty ?Abdomen: soft, non-tender; non-distended ?Rectal: approx 1 x 2 cm area of skin thickening lateral to R rectum; tolerated rectal exam well; no areas of fluctuance; no bleeding or drainage ?Skin: warm and dry ?Neurologic: normal gait ?Psychological: alert and cooperative; normal mood and affect ? ?Labs: ?Results for orders placed or performed during the hospital encounter of 09/19/19  ?Cytology (oral, anal, urethral) ancillary only  ?Result Value Ref Range  ? Chlamydia Negative   ? Neisseria Gonorrhea Negative   ? Trichomonas Negative   ? Comment Normal Reference Ranger Chlamydia - Negative   ? Comment    ?  Normal Reference Range Neisseria Gonorrhea - Negative  ? Comment Normal Reference Range Trichomonas - Negative   ? ?Labs Reviewed - No data to display ? ?Imaging: ?No results found. ? ?No Known Allergies ? ?Past Medical History:  ?Diagnosis Date  ? Diabetes mellitus without complication (HCC)   ? Hyperlipidemia   ? Hypertension   ? ?Social History  ? ?Socioeconomic History  ? Marital status: Single  ?  Spouse name: Not on file  ? Number of children: Not on file  ? Years of education: Not on file  ? Highest education level: Not on file  ?Occupational History  ? Not on file  ?Tobacco Use  ? Smoking status: Every Day  ?  Types: Cigars  ? Smokeless tobacco: Never  ?  Vaping Use  ? Vaping Use: Never used  ?Substance and Sexual Activity  ? Alcohol use: No  ? Drug use: Yes  ?  Types: Marijuana  ?  Comment: occ  ? Sexual activity: Yes  ?  Birth control/protection: None  ?Other Topics Concern  ? Not on file  ?Social History Narrative  ? Not on file  ? ?Social Determinants of Health  ? ?Financial Resource Strain: Not on file  ?Food Insecurity: Not on file  ?Transportation Needs: Not on file  ?Physical Activity: Not on file  ?Stress: Not on  file  ?Social Connections: Not on file  ?Intimate Partner Violence: Not on file  ? ?Family History  ?Family history unknown: Yes  ? ?History reviewed. No pertinent surgical history. ? ?  ?Mardella Layman, MD ?05/07/21 1840 ? ?

## 2021-05-16 ENCOUNTER — Emergency Department (HOSPITAL_COMMUNITY)
Admission: EM | Admit: 2021-05-16 | Discharge: 2021-05-16 | Disposition: A | Discharge status: Home / Self Care | Payer: Self-pay | Attending: Emergency Medicine | Admitting: Emergency Medicine

## 2021-05-16 ENCOUNTER — Other Ambulatory Visit: Payer: Self-pay

## 2021-05-16 ENCOUNTER — Encounter (HOSPITAL_COMMUNITY): Payer: Self-pay

## 2021-05-16 DIAGNOSIS — I10 Essential (primary) hypertension: Secondary | ICD-10-CM | POA: Insufficient documentation

## 2021-05-16 DIAGNOSIS — Z794 Long term (current) use of insulin: Secondary | ICD-10-CM | POA: Insufficient documentation

## 2021-05-16 DIAGNOSIS — K0889 Other specified disorders of teeth and supporting structures: Secondary | ICD-10-CM

## 2021-05-16 DIAGNOSIS — E119 Type 2 diabetes mellitus without complications: Secondary | ICD-10-CM | POA: Insufficient documentation

## 2021-05-16 DIAGNOSIS — K029 Dental caries, unspecified: Secondary | ICD-10-CM | POA: Insufficient documentation

## 2021-05-16 DIAGNOSIS — Z79899 Other long term (current) drug therapy: Secondary | ICD-10-CM | POA: Insufficient documentation

## 2021-05-16 MED ORDER — AMOXICILLIN-POT CLAVULANATE 875-125 MG PO TABS: 1.0000 | ORAL_TABLET | Freq: Two times a day (BID) | ORAL | 0 refills | Status: DC

## 2021-05-16 MED ORDER — CHLORHEXIDINE GLUCONATE 0.12 % MT SOLN: 15.0000 mL | Freq: Two times a day (BID) | OROMUCOSAL | 0 refills | Status: AC

## 2021-05-16 MED ORDER — NAPROXEN 375 MG PO TABS: 375.0000 mg | ORAL_TABLET | Freq: Two times a day (BID) | ORAL | 0 refills | Status: DC

## 2021-05-16 MED ORDER — LIDOCAINE VISCOUS HCL 2 % MT SOLN
15.0000 mL | Freq: Once | OROMUCOSAL | Status: AC
  Administered 2021-05-16: 15 mL via OROMUCOSAL
  Filled 2021-05-16: qty 15

## 2021-05-16 MED ORDER — LIDOCAINE VISCOUS HCL 2 % MT SOLN: 15.0000 mL | OROMUCOSAL | 0 refills | Status: AC | PRN

## 2021-05-16 NOTE — ED Triage Notes (Signed)
Right lower broken molars for "a while". Pain radiating to right ear.  ?

## 2021-05-16 NOTE — ED Provider Notes (Signed)
?[facility] ?Provider Note ? ? ?CSN: [id number] ?Arrival date & time: 05/16/21  1946 ? ?  ? ?History ? ?Chief Complaint  ?Patient presents with  ? Dental Pain  ? ? ?Jerome Hill is a 31 y.o. male with a past medical history of diabetes mellitus, hypertension, and hyperlipidemia.  Presents to the emergency department with a chief complaint of dental pain.  Dental pain is located to right lower molars.  Pain has been present over the last 3 days.  Patient states that he has had deterioration of his back right molars over the last year.  Patient has had intermittent pain over this time.  Patient reports trying Aleve today with minimal improvement in his pain.   ? ?Patient denies any fevers, chills, neck pain, neck stiffness, facial swelling, trismus, hot potato voice, drooling, trouble swallowing, dyspnea. ? ? ?Dental Pain ?Associated symptoms: no drooling, no facial swelling and no fever   ? ?  ? ?Home Medications ?Prior to Admission medications   ?Medication Sig Start Date End Date Taking? Authorizing Provider  ?diclofenac (VOLTAREN) 75 MG EC tablet Take 75 mg by mouth 2 (two) times daily.    [provider]  ?doxycycline (VIBRAMYCIN) 100 MG capsule Take 1 capsule (100 mg total) by mouth 2 (two) times daily. 05/07/21   Vanessa Kick, MD  ?glimepiride (AMARYL) 4 MG tablet Take 0.5-1 tablets (2-4 mg total) by mouth daily before breakfast. Take 2 mg daily with breakfast. Increase to 4 mg if your blood sugars are consistently running above 250 mg/dL ?Patient not taking: Reported on 03/11/2021 03/15/19   Kerin Perna, NP  ?ibuprofen (ADVIL) 200 MG tablet Take 200 mg by mouth every 6 (six) hours as needed for headache or moderate pain.    [provider]  ?Insulin Glargine (BASAGLAR KWIKPEN) 100 UNIT/ML SOPN Inject 0.16 mLs (16 Units total) into the skin daily. ?Patient not taking: Reported on 03/11/2021 03/15/19   Kerin Perna, NP  ?lidocaine (XYLOCAINE) 2 %  solution Use as directed 15 mLs in the mouth or throat every 3 (three) hours as needed for mouth pain. ?Patient not taking: Reported on 03/11/2021 03/20/20   McDonald, Mia A, PA-C  ?lisinopril (ZESTRIL) 2.5 MG tablet Take 1 tablet (2.5 mg total) by mouth daily. ?Patient not taking: Reported on 03/11/2021 03/15/19   Kerin Perna, NP  ?metaxalone (SKELAXIN) 800 MG tablet Take 800 mg by mouth 3 (three) times daily.    [provider]  ?metFORMIN (GLUCOPHAGE) 1000 MG tablet Take 1 tablet (1,000 mg total) by mouth 2 (two) times daily with a meal. ?Patient not taking: Reported on 03/11/2021 03/15/19   Kerin Perna, NP  ?nystatin (MYCOSTATIN) 100000 UNIT/ML suspension Take 5 mLs (500,000 Units total) by mouth 4 (four) times daily. Swish and spit ?Patient not taking: Reported on 02/27/2019 02/17/19   Augusto Gamble B, NP  ?pravastatin (PRAVACHOL) 20 MG tablet Take 1 tablet (20 mg total) by mouth daily. ?Patient not taking: Reported on 03/11/2021 03/15/19   Kerin Perna, NP  ?   ? ?Allergies    ?Patient has no known allergies.   ? ?Review of Systems   ?Review of Systems  ?Constitutional:  Negative for chills and fever.  ?HENT:  Positive for dental problem. Negative for drooling, facial swelling, sore throat, trouble swallowing and voice change.   ?Eyes:  Negative for visual disturbance.  ?Musculoskeletal:  Negative for neck stiffness.  ?Psychiatric/Behavioral:  Negative for confusion.   ? ?  Physical Exam ?Updated Vital Signs ?BP (!) 144/104 (BP Location: Right Arm)   Pulse 82   Temp 98 ?F (36.7 ?C) (Oral)   Resp 19   Ht 5\' 11"  (1.803 m)   Wt 127 kg   SpO2 98%   BMI 39.05 kg/m?  ?Physical Exam ?Vitals and nursing note reviewed.  ?Constitutional:   ?   General: He is not in acute distress. ?   Appearance: He is not ill-appearing, toxic-appearing or diaphoretic.  ?HENT:  ?   Head: Normocephalic.  ?   Jaw: No trismus, tenderness, swelling, pain on movement or malocclusion.  ?   Mouth/Throat:  ?   Lips: Pink.  No lesions.  ?   Mouth: Mucous membranes are moist. No oral lesions or angioedema.  ?   Dentition: Abnormal dentition. Dental tenderness and dental caries present. No dental abscesses.  ?   Tongue: No lesions. Tongue does not deviate from midline.  ?   Palate: No mass and lesions.  ?   Pharynx: Oropharynx is clear. Uvula midline. No pharyngeal swelling, oropharyngeal exudate, posterior oropharyngeal erythema or uvula swelling.  ?   Tonsils: No tonsillar exudate or tonsillar abscesses. 1+ on the right. 1+ on the left.  ? ?   Comments: Dental caries and tenderness to teeth as noted above.  No dental abscess.  Handles oral secretions without difficulty. ?Eyes:  ?   General: No scleral icterus.    ?   Right eye: No discharge.     ?   Left eye: No discharge.  ?Neck:  ?   Comments: No swelling to submandibular space ?Cardiovascular:  ?   Rate and Rhythm: Normal rate.  ?Pulmonary:  ?   Effort: Pulmonary effort is normal.  ?Musculoskeletal:  ?   Cervical back: Normal range of motion and neck supple. No edema, erythema, signs of trauma, rigidity, torticollis or crepitus. No pain with movement, spinous process tenderness or muscular tenderness. Normal range of motion.  ?Skin: ?   General: Skin is warm and dry.  ?Neurological:  ?   General: No focal deficit present.  ?   Mental Status: He is alert.  ?   GCS: GCS eye subscore is 4. GCS verbal subscore is 5. GCS motor subscore is 6.  ?Psychiatric:     ?   Behavior: Behavior is cooperative.  ? ? ?ED Results / Procedures / Treatments   ?Labs ?(all labs ordered are listed, but only abnormal results are displayed) ?Labs Reviewed - No data to display ? ?EKG ?None ? ?Radiology ?No results found. ? ?Procedures ?Procedures  ? ? ?Medications Ordered in ED ?Medications  ?lidocaine (XYLOCAINE) 2 % viscous mouth solution 15 mL (15 mLs Mouth/Throat Given 05/16/21 2147)  ? ? ?ED Course/ Medical Decision Making/ A&P ?  ?                        ?Medical Decision Making ?Risk ?Prescription drug  management. ? ? ?Alert 31 year old male in no acute distress, nontoxic-appearing.  Presents to the emergency department chief complaint of dental pain. ? ?Information is obtained from patient.  Past medical records were reviewed including previous provider notes.  Patient has past medical history as outlined in HPI which complicates his care. ? ?On physical exam patient has dental caries and dental tenderness correlating to area of pain.  No dental abscess or purulent discharge noted in the area.  Due to patient's physical exam concern for dental infection.  We will  start patient on 7-day course of Augmentin.  We will give patient prescription for naproxen as well as viscous lidocaine for pain management.  Patient additionally given prescription for Peridex.  Patient advised to follow-up with dentist in outpatient setting. ? ?Discussed results, findings, treatment and follow up. Patient advised of return precautions. Patient verbalized understanding and agreed with plan. ? ? ? ? ? ? ? ? ?Final Clinical Impression(s) / ED Diagnoses ?Final diagnoses:  ?Pain, dental  ? ? ?Rx / DC Orders ?ED Discharge Orders   ? ?      Ordered  ?  chlorhexidine (PERIDEX) 0.12 % solution  2 times daily       ? 05/16/21 2108  ?  amoxicillin-clavulanate (AUGMENTIN) 875-125 MG tablet  Every 12 hours       ? 05/16/21 2108  ?  naproxen (NAPROSYN) 375 MG tablet  2 times daily       ? 05/16/21 2108  ?  lidocaine (XYLOCAINE) 2 % solution  As needed       ? 05/16/21 2108  ? ?  ?  ? ?  ? ? ?  ?Loni Beckwith, PA-C ?05/17/21 0141 ? ?  ?Davonna Belling, MD ?05/17/21 1103 ? ?

## 2021-05-16 NOTE — Discharge Instructions (Addendum)
You came to the emergency department today to be evaluated for your dental pain.  Your physical exam was concerning for a dental infection.  Due to this you were started on the antibiotic Augmentin.  I have given you prescription for naproxen, you may take this medication to help with your pain.  Please do not take this medication in combination with ibuprofen, Motrin, or Aleve.  You may take Tylenol 1000 mg every 8 hours to help with your pain as well.  I have also given you a prescription for viscous lidocaine.  Please swish this around your mouth and spit it out as needed to help with your dental pain.  If you swallow this medication you may only use it every 3 hours. ? ?It is very important that you follow-up with a dentist.  Please call one of the providers listed on your discharge paperwork for follow-up. ? ?You may have diarrhea from the antibiotics.  It is very important that you continue to take the antibiotics even if you get diarrhea unless a medical professional tells you that you may stop taking them.  If you stop too early the bacteria you are being treated for will become stronger and you may need different, more powerful antibiotics that have more side effects and worsening diarrhea.  Please stay well hydrated and consider probiotics as they may decrease the severity of your diarrhea.   ? ?Please take Tylenol (acetaminophen) to relieve your pain.  You make take tylenol, up to 1,000 mg (two extra strength pills) every 8 hours as needed.  Do not take more than 3,000 mg tylenol in a 24 hour period (not more than one dose every 8 hours.  Please check all medication labels as many medications such as pain and cold medications may contain tylenol.  Do not drink alcohol while taking these medications. ? ?Get help right away if: ?You are unable to open your mouth. ?You are having trouble breathing or swallowing. ?You have a fever. ?You notice that your face, neck, or jaw is swollen. ?

## 2021-05-16 NOTE — ED Notes (Signed)
RN reviewed discharge instructions with pt. Pt verbalized understanding and had no further questions. VSS upon discharge.  

## 2022-01-15 ENCOUNTER — Encounter (HOSPITAL_COMMUNITY): Payer: Self-pay | Admitting: *Deleted

## 2022-01-15 ENCOUNTER — Ambulatory Visit (HOSPITAL_COMMUNITY)
Admission: EM | Admit: 2022-01-15 | Discharge: 2022-01-15 | Disposition: A | Discharge status: Home / Self Care | Payer: Self-pay | Attending: Physician Assistant | Admitting: Physician Assistant

## 2022-01-15 DIAGNOSIS — L03012 Cellulitis of left finger: Secondary | ICD-10-CM

## 2022-01-15 MED ORDER — DOXYCYCLINE HYCLATE 100 MG PO CAPS: 100.0000 mg | ORAL_CAPSULE | Freq: Two times a day (BID) | ORAL | 0 refills | Status: DC

## 2022-01-15 NOTE — Discharge Instructions (Addendum)
Soak finger and swollen area buttock 20 minutes 4 times a day for the next 2 days.  Return if any problems.

## 2022-01-15 NOTE — ED Provider Notes (Signed)
[facility]    CSN: [id number] Arrival date & time: 01/15/22  1653      History   Chief Complaint Chief Complaint  Patient presents with   Abscess   Hand Pain    HPI Jerome Hill is a 31 y.o. male.   Patient complains of an infection to the left third finger.  Patient reports he had a small nick to the side of his finger.  Patient reports area is now swollen and painful.  Patient also has an area that is swollen and painful on his left buttock.  Patient denies any fever or chills  The history is provided by the patient. No language interpreter was used.  Abscess Location:  Finger Finger abscess location:  L long finger Size:  1 cm Abscess quality: painful, redness and warmth  Draining: 1. Red streaking: no   Progression:  Worsening Chronicity:  New Relieved by:  Nothing Worsened by:  Nothing Ineffective treatments:  None tried Associated symptoms: no fever   Hand Pain    Past Medical History:  Diagnosis Date   Diabetes mellitus without complication (HCC)    Hyperlipidemia    Hypertension     There are no problems to display for this patient.   History reviewed. No pertinent surgical history.     Home Medications    Prior to Admission medications   Medication Sig Start Date End Date Taking? Authorizing Provider  doxycycline (VIBRAMYCIN) 100 MG capsule Take 1 capsule (100 mg total) by mouth 2 (two) times daily. 01/15/22  Yes Cheron Schaumann K, PA-C  amoxicillin-clavulanate (AUGMENTIN) 875-125 MG tablet Take 1 tablet by mouth every 12 (twelve) hours. 05/16/21   Haskel Schroeder, PA-C  chlorhexidine (PERIDEX) 0.12 % solution Use as directed 15 mLs in the mouth or throat 2 (two) times daily. 05/16/21   Haskel Schroeder, PA-C  glimepiride (AMARYL) 4 MG tablet Take 0.5-1 tablets (2-4 mg total) by mouth daily before breakfast. Take 2 mg daily with breakfast. Increase to 4 mg if your blood sugars are consistently running above 250 mg/dL Patient  not taking: Reported on 03/11/2021 03/15/19   Grayce Sessions, NP  ibuprofen (ADVIL) 200 MG tablet Take 200 mg by mouth every 6 (six) hours as needed for headache or moderate pain.    [provider]  Insulin Glargine (BASAGLAR KWIKPEN) 100 UNIT/ML SOPN Inject 0.16 mLs (16 Units total) into the skin daily. Patient not taking: Reported on 03/11/2021 03/15/19   Grayce Sessions, NP  lidocaine (XYLOCAINE) 2 % solution Use as directed 15 mLs in the mouth or throat as needed for mouth pain. 05/16/21   Haskel Schroeder, PA-C  lisinopril (ZESTRIL) 2.5 MG tablet Take 1 tablet (2.5 mg total) by mouth daily. Patient not taking: Reported on 03/11/2021 03/15/19   Grayce Sessions, NP  metaxalone (SKELAXIN) 800 MG tablet Take 800 mg by mouth 3 (three) times daily.    [provider]  metFORMIN (GLUCOPHAGE) 1000 MG tablet Take 1 tablet (1,000 mg total) by mouth 2 (two) times daily with a meal. Patient not taking: Reported on 03/11/2021 03/15/19   Grayce Sessions, NP  naproxen (NAPROSYN) 375 MG tablet Take 1 tablet (375 mg total) by mouth 2 (two) times daily. 05/16/21   Haskel Schroeder, PA-C  nystatin (MYCOSTATIN) 100000 UNIT/ML suspension Take 5 mLs (500,000 Units total) by mouth 4 (four) times daily. Swish and spit Patient not taking: Reported on 02/27/2019 02/17/19   Georgetta Haber, NP  pravastatin (PRAVACHOL) 20 MG tablet Take 1 tablet (20 mg total) by mouth daily. Patient not taking: Reported on 03/11/2021 03/15/19   Grayce Sessions, NP    Family History Family History  Family history unknown: Yes    Social History Social History   Tobacco Use   Smoking status: Every Day    Types: Cigars   Smokeless tobacco: Never  Vaping Use   Vaping Use: Never used  Substance Use Topics   Alcohol use: No   Drug use: Yes    Types: Marijuana    Comment: occ     Allergies   Patient has no known allergies.   Review of Systems Review of Systems  Constitutional:  Negative for  fever.  All other systems reviewed and are negative.    Physical Exam Triage Vital Signs ED Triage Vitals  Enc Vitals Group     BP 01/15/22 1744 132/87     Pulse Rate 01/15/22 1744 91     Resp 01/15/22 1744 18     Temp 01/15/22 1744 98.2 F (36.8 C)     Temp Source 01/15/22 1744 Oral     SpO2 01/15/22 1744 97 %     Weight --      Height --      Head Circumference --      Peak Flow --      Pain Score 01/15/22 1743 6     Pain Loc --      Pain Edu? --      Excl. in GC? --    No data found.  Updated Vital Signs BP 132/87 (BP Location: Right Arm)   Pulse 91   Temp 98.2 F (36.8 C) (Oral)   Resp 18   SpO2 97%   Visual Acuity Right Eye Distance:   Left Eye Distance:   Bilateral Distance:    Right Eye Near:   Left Eye Near:    Bilateral Near:     Physical Exam Vitals and nursing note reviewed.  Constitutional:      Appearance: He is well-developed.  HENT:     Head: Normocephalic.  Pulmonary:     Effort: Pulmonary effort is normal.  Abdominal:     General: There is no distension.  Musculoskeletal:        General: Swelling and tenderness present.     Cervical back: Normal range of motion.     Comments: Red swollen area distal left finger  Skin:    General: Skin is warm.     Comments: 2 cm area of redness left buttock  Neurological:     General: No focal deficit present.     Mental Status: He is alert and oriented to person, place, and time.  Psychiatric:        Mood and Affect: Mood normal.      UC Treatments / Results  Labs (all labs ordered are listed, but only abnormal results are displayed) Labs Reviewed - No data to display  EKG   Radiology No results found.  Procedures Incision and Drainage  Date/Time: 01/15/2022 8:28 PM  Performed by: [name], PA-C Authorized by: [name], PA-C   Consent:    Consent obtained:  Verbal   Consent given by:  Patient   Risks, benefits, and alternatives were discussed: yes     Risks  discussed:  Bleeding   Alternatives discussed:  No treatment Universal protocol:    Procedure explained and questions answered to patient or proxy's satisfaction:  no     Immediately prior to procedure, a time out was called: yes     Patient identity confirmed:  Verbally with patient Location:    Type:  Abscess   Size:  1 Pre-procedure details:    Skin preparation:  Antiseptic wash Sedation:    Sedation type:  None Anesthesia:    Anesthesia method:  None Procedure type:    Complexity:  Simple Procedure details:    Needle aspiration: yes     Needle size:  18 G   Incision types:  Stab incision Post-procedure details:    Procedure completion:  Tolerated  (including critical care time)  Medications Ordered in UC Medications - No data to display  Initial Impression / Assessment and Plan / UC Course  I have reviewed the triage vital signs and the nursing notes.  Pertinent labs & imaging results that were available during my care of the patient were reviewed by me and considered in my medical decision making (see chart for details).     Paronychia drained with 18-gauge see procedure note patient counseled on possible infection to left buttock patient is advised to soak finger and left buttock 20 minutes 4 times a day he is given a prescription for doxycycline patient is advised to return for recheck if increased swelling or pain Final Clinical Impressions(s) / UC Diagnoses   Final diagnoses:  Paronychia of finger, left     Discharge Instructions      Soak finger and swollen area buttock 20 minutes 4 times a day for the next 2 days.  Return if any problems.    ED Prescriptions     Medication Sig Dispense Auth. Provider   doxycycline (VIBRAMYCIN) 100 MG capsule Take 1 capsule (100 mg total) by mouth 2 (two) times daily. 20 capsule [name], [place]      PDMP not reviewed this encounter. An After Visit Summary was printed and given to the patient.    [name], [place] 01/15/22 2029

## 2022-01-15 NOTE — ED Triage Notes (Signed)
Pt states his left middle has been swollen and painful for a couple days he doesn't remember doing anything to cause it. He has been icing it without relief.   He also has a boil on his left buttock X 2 days ago its is painful.

## 2022-01-18 ENCOUNTER — Other Ambulatory Visit: Payer: Self-pay

## 2022-01-18 ENCOUNTER — Emergency Department (HOSPITAL_COMMUNITY)
Admission: EM | Admit: 2022-01-18 | Discharge: 2022-01-19 | Discharge status: Against Medical Advice | Payer: Self-pay | Attending: Physician Assistant | Admitting: Physician Assistant

## 2022-01-18 DIAGNOSIS — L0231 Cutaneous abscess of buttock: Secondary | ICD-10-CM | POA: Insufficient documentation

## 2022-01-18 DIAGNOSIS — Z5321 Procedure and treatment not carried out due to patient leaving prior to being seen by health care provider: Secondary | ICD-10-CM | POA: Insufficient documentation

## 2022-01-18 LAB — COMPREHENSIVE METABOLIC PANEL
ALT: 24 U/L (ref 0–44)
AST: 16 U/L (ref 15–41)
Albumin: 3.8 g/dL (ref 3.5–5.0)
Alkaline Phosphatase: 65 U/L (ref 38–126)
Anion gap: 12 (ref 5–15)
BUN: 8 mg/dL (ref 6–20)
CO2: 25 mmol/L (ref 22–32)
Calcium: 9.5 mg/dL (ref 8.9–10.3)
Chloride: 97 mmol/L — ABNORMAL LOW (ref 98–111)
Creatinine, Ser: 0.96 mg/dL (ref 0.61–1.24)
GFR, Estimated: >60 mL/min (ref >60)
Glucose, Bld: 371 mg/dL — ABNORMAL HIGH (ref 70–99)
Potassium: 4.2 mmol/L (ref 3.5–5.1)
Sodium: 134 mmol/L — ABNORMAL LOW (ref 135–145)
Total Bilirubin: 0.9 mg/dL (ref 0.3–1.2)
Total Protein: 7.3 g/dL (ref 6.5–8.1)

## 2022-01-18 LAB — CBC WITH DIFFERENTIAL/PLATELET
Abs Immature Granulocytes: 0.07 10*3/uL (ref 0.00–0.07)
Basophils Absolute: 0.1 10*3/uL (ref 0.0–0.1)
Basophils Relative: 1 %
Eosinophils Absolute: 0.1 10*3/uL (ref 0.0–0.5)
Eosinophils Relative: 1 %
HCT: 46.8 % (ref 39.0–52.0)
Hemoglobin: 16.3 g/dL (ref 13.0–17.0)
Immature Granulocytes: 1 %
Lymphocytes Relative: 15 %
Lymphs Abs: 1.9 10*3/uL (ref 0.7–4.0)
MCH: 30.8 pg (ref 26.0–34.0)
MCHC: 34.8 g/dL (ref 30.0–36.0)
MCV: 88.3 fL (ref 80.0–100.0)
Monocytes Absolute: 1 10*3/uL (ref 0.1–1.0)
Monocytes Relative: 8 %
Neutro Abs: 9 10*3/uL — ABNORMAL HIGH (ref 1.7–7.7)
Neutrophils Relative %: 74 %
Platelets: 188 10*3/uL (ref 150–400)
RBC: 5.3 MIL/uL (ref 4.22–5.81)
RDW: 12.1 % (ref 11.5–15.5)
WBC: 12 10*3/uL — ABNORMAL HIGH (ref 4.0–10.5)
nRBC: 0 % (ref 0.0–0.2)

## 2022-01-18 LAB — CBG MONITORING, ED: Glucose-Capillary: 381 mg/dL — ABNORMAL HIGH (ref 70–99)

## 2022-01-18 NOTE — ED Provider Triage Note (Signed)
Emergency Medicine Provider Triage Evaluation Note  Jerome Hill , a 31 y.o. male  was evaluated in triage.  Pt complains of increased swelling to buttock.  Pt seen by me at urgent care 2 days ago and treated with doxy.    Review of Systems  Positive:  Negative:   Physical Exam  BP 130/89   Pulse (!) 112   Temp 99.3 F (37.4 C)   Resp 16   SpO2 98%  Gen:   Awake, no distress   Resp:  Normal effort  MSK:   Moves extremities without difficulty  Other:  Left buttock,  increased swelling   Medical Decision Making  Medically screening exam initiated at 2:21 PM.  Appropriate orders placed.  Jerome Hill was informed that the remainder of the evaluation will be completed by another provider, this initial triage assessment does not replace that evaluation, and the importance of remaining in the ED until their evaluation is complete.     Jerome Hill, [place] 01/18/22 1422

## 2022-01-18 NOTE — ED Triage Notes (Signed)
Pt reports abscess to L buttock 5 days. Prescribed abx at [facility] on Friday, which he is taking without relief.

## 2022-01-19 ENCOUNTER — Ambulatory Visit (HOSPITAL_COMMUNITY)
Admission: EM | Admit: 2022-01-19 | Discharge: 2022-01-19 | Disposition: A | Discharge status: Home / Self Care | Payer: Self-pay

## 2022-01-19 ENCOUNTER — Encounter (HOSPITAL_COMMUNITY): Payer: Self-pay | Admitting: Physician Assistant

## 2022-01-19 DIAGNOSIS — L0291 Cutaneous abscess, unspecified: Secondary | ICD-10-CM

## 2022-01-19 DIAGNOSIS — L0231 Cutaneous abscess of buttock: Secondary | ICD-10-CM

## 2022-01-19 NOTE — ED Notes (Signed)
X2 no response for vitals recheck  

## 2022-01-19 NOTE — ED Provider Notes (Signed)
[facility]    CSN: [id number] Arrival date & time: 01/19/22  0488      History   Chief Complaint Chief Complaint  Patient presents with   Abscess    HPI Jerome Hill is a 31 y.o. male.   Here today for evaluation of an abscess to his left buttock that is been present for the last 6 days.  He reports that he started taking antibiotics about 4 days ago but states symptoms have continued.  He has significant pain to the area as well as swelling and has difficulty sitting due to pain.  He has not had any known drainage.  He does deny doing any warm compresses or soaks as recommended by the emergency room.  He has not had fever.  He does not report any other symptoms.  The history is provided by the patient.    Past Medical History:  Diagnosis Date   Diabetes mellitus without complication (HCC)    Hyperlipidemia    Hypertension     There are no problems to display for this patient.   History reviewed. No pertinent surgical history.     Home Medications    Prior to Admission medications   Medication Sig Start Date End Date Taking? Authorizing Provider  chlorhexidine (PERIDEX) 0.12 % solution Use as directed 15 mLs in the mouth or throat 2 (two) times daily. 05/16/21   Haskel Schroeder, PA-C  doxycycline (VIBRAMYCIN) 100 MG capsule Take 1 capsule (100 mg total) by mouth 2 (two) times daily. 01/15/22   [name], PA-C  glimepiride (AMARYL) 4 MG tablet Take 0.5-1 tablets (2-4 mg total) by mouth daily before breakfast. Take 2 mg daily with breakfast. Increase to 4 mg if your blood sugars are consistently running above 250 mg/dL Patient not taking: Reported on 03/11/2021 03/15/19   Grayce Sessions, NP  ibuprofen (ADVIL) 200 MG tablet Take 200 mg by mouth every 6 (six) hours as needed for headache or moderate pain.    [provider]  Insulin Glargine (BASAGLAR KWIKPEN) 100 UNIT/ML SOPN Inject 0.16 mLs (16 Units total) into the skin  daily. Patient not taking: Reported on 03/11/2021 03/15/19   Grayce Sessions, NP  lidocaine (XYLOCAINE) 2 % solution Use as directed 15 mLs in the mouth or throat as needed for mouth pain. 05/16/21   Haskel Schroeder, PA-C  lisinopril (ZESTRIL) 2.5 MG tablet Take 1 tablet (2.5 mg total) by mouth daily. Patient not taking: Reported on 03/11/2021 03/15/19   Grayce Sessions, NP  metaxalone (SKELAXIN) 800 MG tablet Take 800 mg by mouth 3 (three) times daily.    [provider]  metFORMIN (GLUCOPHAGE) 1000 MG tablet Take 1 tablet (1,000 mg total) by mouth 2 (two) times daily with a meal. Patient not taking: Reported on 03/11/2021 03/15/19   Grayce Sessions, NP  naproxen (NAPROSYN) 375 MG tablet Take 1 tablet (375 mg total) by mouth 2 (two) times daily. 05/16/21   Haskel Schroeder, PA-C  nystatin (MYCOSTATIN) 100000 UNIT/ML suspension Take 5 mLs (500,000 Units total) by mouth 4 (four) times daily. Swish and spit Patient not taking: Reported on 02/27/2019 02/17/19   Linus Mako B, NP  pravastatin (PRAVACHOL) 20 MG tablet Take 1 tablet (20 mg total) by mouth daily. Patient not taking: Reported on 03/11/2021 03/15/19   Grayce Sessions, NP    Family History Family History  Family history unknown: Yes    Social History Social History  Tobacco Use   Smoking status: Every Day    Types: Cigars   Smokeless tobacco: Never  Vaping Use   Vaping Use: Never used  Substance Use Topics   Alcohol use: No   Drug use: Yes    Types: Marijuana    Comment: occ     Allergies   Patient has no known allergies.   Review of Systems Review of Systems  Constitutional:  Negative for chills and fever.  Eyes:  Negative for discharge and redness.  Respiratory:  Negative for shortness of breath.   Skin:  Positive for color change. Negative for wound.  Neurological:  Negative for numbness.     Physical Exam Triage Vital Signs ED Triage Vitals  Enc Vitals Group     BP      Pulse       Resp      Temp      Temp src      SpO2      Weight      Height      Head Circumference      Peak Flow      Pain Score      Pain Loc      Pain Edu?      Excl. in [place]?    No data found.  Updated Vital Signs BP (!) 135/93 (BP Location: Right Arm)   Pulse (!) 103   Temp 98.6 F (37 C) (Oral)   Resp 18   SpO2 97%      Physical Exam Vitals and nursing note reviewed.  Constitutional:      General: He is not in acute distress.    Appearance: Normal appearance. He is not ill-appearing.  HENT:     Head: Normocephalic and atraumatic.  Eyes:     Conjunctiva/sclera: Conjunctivae normal.  Cardiovascular:     Rate and Rhythm: Normal rate.  Pulmonary:     Effort: Pulmonary effort is normal.  Musculoskeletal:     Comments: Area of induration noted to left medial glute at gluteal cleft. No active drainage noted  Neurological:     Mental Status: He is alert.  Psychiatric:        Mood and Affect: Mood normal.        Behavior: Behavior normal.        Thought Content: Thought content normal.      UC Treatments / Results  Labs (all labs ordered are listed, but only abnormal results are displayed) Labs Reviewed - No data to display  EKG   Radiology No results found.  Procedures Procedures (including critical care time)  Medications Ordered in UC Medications - No data to display  Initial Impression / Assessment and Plan / UC Course  I have reviewed the triage vital signs and the nursing notes.  Pertinent labs & imaging results that were available during my care of the patient were reviewed by me and considered in my medical decision making (see chart for details).    Discussed options of trial of warm soaks vs incision and drainage and patient prefers to try warm soaks and continued antibiotic therapy. Encouraged follow up if he does not have improvement with same and discussed ultimately he may still need to have incision and drainage procedure. Patient expresses  understanding.   Final Clinical Impressions(s) / UC Diagnoses   Final diagnoses:  Abscess     Discharge Instructions       Please try warm epsom soaks to promote spontaneous drainage.  Follow up with any further concerns.      ED Prescriptions   None    PDMP not reviewed this encounter.   Tomi Bamberger, PA-C 01/19/22 1114

## 2022-01-19 NOTE — Discharge Instructions (Signed)
  Please try warm epsom soaks to promote spontaneous drainage.    Follow up with any further concerns.

## 2022-01-19 NOTE — ED Triage Notes (Signed)
Pt c/o abscess to lt buttock x6 days. States given antibiotics on Friday here and is still taking them. States went to ED yesterday but left. States unable to sit d/t pain., denies drainage.

## 2022-05-07 ENCOUNTER — Encounter (HOSPITAL_COMMUNITY): Payer: Self-pay

## 2022-05-07 ENCOUNTER — Ambulatory Visit (HOSPITAL_COMMUNITY)
Admission: EM | Admit: 2022-05-07 | Discharge: 2022-05-07 | Disposition: A | Discharge status: Home / Self Care | Payer: Self-pay

## 2022-05-07 DIAGNOSIS — K047 Periapical abscess without sinus: Secondary | ICD-10-CM

## 2022-05-07 MED ORDER — DICLOFENAC SODIUM 50 MG PO TBEC: 50.0000 mg | DELAYED_RELEASE_TABLET | Freq: Two times a day (BID) | ORAL | 0 refills | Status: AC

## 2022-05-07 MED ORDER — CLINDAMYCIN HCL 300 MG PO CAPS: 300.0000 mg | ORAL_CAPSULE | Freq: Three times a day (TID) | ORAL | 0 refills | Status: AC

## 2022-05-07 NOTE — ED Provider Notes (Signed)
[place]    CSN: [id number] Arrival date & time: 05/07/22  1450      History   Chief Complaint Chief Complaint  Patient presents with   Dental Pain    HPI Jerome Hill is a 32 y.o. male.   Patient presents with concerns of right upper tooth pain and overlying cheek swelling. He reports he first noticed it almost a week ago. He went to another urgent care on Tuesday and was prescribed amoxicillin. He has been taking the amoxicillin as prescribed and reports no improvement and states it seems to be getting worse. He reports increasing swelling and discomfort. The patient denies fever, difficulty swallowing or breathing, or feeling unwell otherwise. He has been taking OTC medicine for pain. He has not been able to get in with a dentist yet.   The history is provided by the patient.  Dental Pain Associated symptoms: facial swelling   Associated symptoms: no fever and no headaches     Past Medical History:  Diagnosis Date   Diabetes mellitus without complication ([place])    Hyperlipidemia    Hypertension     There are no problems to display for this patient.   History reviewed. No pertinent surgical history.     Home Medications    Prior to Admission medications   Medication Sig Start Date End Date Taking? Authorizing Provider  amoxicillin (AMOXIL) 875 MG tablet Take 875 mg by mouth 2 (two) times daily. 05/04/22 05/14/22 Yes [provider]  clindamycin (CLEOCIN) 300 MG capsule Take 1 capsule (300 mg total) by mouth 3 (three) times daily for 10 days. 05/07/22 05/17/22 Yes Khalea Ventura L, PA  diclofenac (VOLTAREN) 50 MG EC tablet Take 1 tablet (50 mg total) by mouth 2 (two) times daily. 05/07/22  Yes Tresha Muzio L, PA  ibuprofen (ADVIL) 200 MG tablet Take 200 mg by mouth every 6 (six) hours as needed for headache or moderate pain.   Yes [provider]  chlorhexidine (PERIDEX) 0.12 % solution Use as directed 15 mLs in the mouth or throat 2 (two) times  daily. 05/16/21   Loni Beckwith, PA-C  glimepiride (AMARYL) 4 MG tablet Take 0.5-1 tablets (2-4 mg total) by mouth daily before breakfast. Take 2 mg daily with breakfast. Increase to 4 mg if your blood sugars are consistently running above 250 mg/dL Patient not taking: Reported on 03/11/2021 03/15/19   Kerin Perna, NP  Insulin Glargine (BASAGLAR KWIKPEN) 100 UNIT/ML SOPN Inject 0.16 mLs (16 Units total) into the skin daily. Patient not taking: Reported on 03/11/2021 03/15/19   Kerin Perna, NP  lidocaine (XYLOCAINE) 2 % solution Use as directed 15 mLs in the mouth or throat as needed for mouth pain. 05/16/21   Loni Beckwith, PA-C  lisinopril (ZESTRIL) 2.5 MG tablet Take 1 tablet (2.5 mg total) by mouth daily. Patient not taking: Reported on 03/11/2021 03/15/19   Kerin Perna, NP  metaxalone (SKELAXIN) 800 MG tablet Take 800 mg by mouth 3 (three) times daily.    [provider]  metFORMIN (GLUCOPHAGE) 1000 MG tablet Take 1 tablet (1,000 mg total) by mouth 2 (two) times daily with a meal. Patient not taking: Reported on 03/11/2021 03/15/19   Kerin Perna, NP  nystatin (MYCOSTATIN) 100000 UNIT/ML suspension Take 5 mLs (500,000 Units total) by mouth 4 (four) times daily. Swish and spit Patient not taking: Reported on 02/27/2019 02/17/19   Augusto Gamble B, NP  pravastatin (PRAVACHOL) 20 MG tablet  Take 1 tablet (20 mg total) by mouth daily. Patient not taking: Reported on 03/11/2021 03/15/19   Kerin Perna, NP    Family History Family History  Family history unknown: Yes    Social History Social History   Tobacco Use   Smoking status: Every Day    Types: Cigars   Smokeless tobacco: Never  Vaping Use   Vaping Use: Never used  Substance Use Topics   Alcohol use: No   Drug use: Yes    Types: Marijuana    Comment: occ     Allergies   Patient has no known allergies.   Review of Systems Review of Systems  Constitutional:  Negative for fatigue and  fever.  HENT:  Positive for dental problem and facial swelling. Negative for sore throat, trouble swallowing and voice change.   Respiratory:  Negative for shortness of breath.   Neurological:  Negative for dizziness and headaches.     Physical Exam Triage Vital Signs ED Triage Vitals  Enc Vitals Group     BP 05/07/22 1557 129/84     Pulse Rate 05/07/22 1557 83     Resp 05/07/22 1557 16     Temp 05/07/22 1557 99.1 F (37.3 C)     Temp Source 05/07/22 1557 Oral     SpO2 05/07/22 1557 97 %     Weight 05/07/22 1557 220 lb (99.8 kg)     Height 05/07/22 1557 6' (1.829 m)     Head Circumference --      Peak Flow --      Pain Score 05/07/22 1554 9     Pain Loc --      Pain Edu? --      Excl. in [place]? --    No data found.  Updated Vital Signs BP 129/84 (BP Location: Right Arm)   Pulse 83   Temp 99.1 F (37.3 C) (Oral)   Resp 16   Ht 6' (1.829 m)   Wt 220 lb (99.8 kg)   SpO2 97%   BMI 29.84 kg/m   Visual Acuity Right Eye Distance:   Left Eye Distance:   Bilateral Distance:    Right Eye Near:   Left Eye Near:    Bilateral Near:     Physical Exam Vitals and nursing note reviewed.  Constitutional:      General: He is not in acute distress. HENT:     Head: Atraumatic.     Mouth/Throat:     Mouth: Mucous membranes are moist.     Comments: Moderate swelling to right cheek with tenderness overlying upper posterior teeth. Right upper molar with gum swelling and tenderness. No trismus, drooling.  Eyes:     Conjunctiva/sclera: Conjunctivae normal.     Pupils: Pupils are equal, round, and reactive to light.  Cardiovascular:     Rate and Rhythm: Normal rate and regular rhythm.     Heart sounds: Normal heart sounds.  Pulmonary:     Effort: Pulmonary effort is normal.     Breath sounds: Normal breath sounds.  Musculoskeletal:     Cervical back: Normal range of motion.  Lymphadenopathy:     Cervical: No cervical adenopathy.  Neurological:     Mental Status: He is alert.   Psychiatric:        Mood and Affect: Mood normal.      UC Treatments / Results  Labs (all labs ordered are listed, but only abnormal results are displayed) Labs Reviewed - No data to  display  EKG   Radiology No results found.  Procedures Procedures (including critical care time)  Medications Ordered in UC Medications - No data to display  Initial Impression / Assessment and Plan / UC Course  I have reviewed the triage vital signs and the nursing notes.  Pertinent labs & imaging results that were available during my care of the patient were reviewed by me and considered in my medical decision making (see chart for details).     Amoxicillin alone unlikely to be adequate coverage for typical dental pathogens. Will change to clindamycin. Discussed close ER precautions if persistent worsening as may require IV abx or procedural drainage. Pt stable for outpt tx at this time.  E/M: 1 acute uncomplicated illness, no data, moderate risk due to prescription management  Final Clinical Impressions(s) / UC Diagnoses   Final diagnoses:  Dental infection     Discharge Instructions      Stop the amoxicillin prescribed by the other clinic. Start clindamycin (antibiotic) as prescribed. Take diclofenac as prescribed to help with discomfort - you can also take Tylenol with this. Warm compress may help with discomfort as well.  Follow-up with dentist or oral surgeon for management of your tooth.  Go to the ER if no improvement or worsening even after 24-48hr of the new antibiotics.    ED Prescriptions     Medication Sig Dispense Auth. Provider   clindamycin (CLEOCIN) 300 MG capsule Take 1 capsule (300 mg total) by mouth 3 (three) times daily for 10 days. 30 capsule Abner Greenspan, Abhimanyu Cruces L, PA   diclofenac (VOLTAREN) 50 MG EC tablet Take 1 tablet (50 mg total) by mouth 2 (two) times daily. 10 tablet Abner Greenspan, Haedyn Ancrum L, PA      PDMP not reviewed this encounter.   Delsa Sale, [place] 05/07/22  1630

## 2022-05-07 NOTE — ED Notes (Signed)
Called from lobby with no response

## 2022-05-07 NOTE — Discharge Instructions (Signed)
Stop the amoxicillin prescribed by the other clinic. Start clindamycin (antibiotic) as prescribed. Take diclofenac as prescribed to help with discomfort - you can also take Tylenol with this. Warm compress may help with discomfort as well.  Follow-up with dentist or oral surgeon for management of your tooth.  Go to the ER if no improvement or worsening even after 24-48hr of the new antibiotics.

## 2022-05-07 NOTE — ED Triage Notes (Signed)
Chief Complaint: upper right side dental pain. No oral trauma. States was informed he has a dental abscess.   Onset: This past weekend   Prescriptions or OTC medications tried: Yes- amoxicillin, Aleve, ibuprofen     with no relief
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
## 2022-10-18 ENCOUNTER — Ambulatory Visit (HOSPITAL_COMMUNITY)
Admission: EM | Admit: 2022-10-18 | Discharge: 2022-10-18 | Disposition: A | Discharge status: Home / Self Care | Payer: Self-pay | Attending: Nurse Practitioner | Admitting: Nurse Practitioner

## 2022-10-18 ENCOUNTER — Encounter (HOSPITAL_COMMUNITY): Payer: Self-pay | Admitting: *Deleted

## 2022-10-18 ENCOUNTER — Other Ambulatory Visit: Payer: Self-pay

## 2022-10-18 DIAGNOSIS — L03011 Cellulitis of right finger: Secondary | ICD-10-CM

## 2022-10-18 MED ORDER — DOXYCYCLINE HYCLATE 100 MG PO CAPS: 100.0000 mg | ORAL_CAPSULE | Freq: Two times a day (BID) | ORAL | 0 refills | Status: AC

## 2022-10-18 NOTE — Discharge Instructions (Addendum)
We drained the paronychia (infection around your fingernail) today.  Please start soaking the finger in warm water at home to help the rest of the infection drainage out.  Start taking the doxycycline twice daily for 7 days to treat the rest of the infection that we could not drain.  Please seek care if symptoms worsen despite treatment.

## 2022-10-18 NOTE — ED Triage Notes (Signed)
Pt has an infection at base of RT ring finger for one week. Pt unsure of cause.
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
## 2022-11-20 ENCOUNTER — Emergency Department (HOSPITAL_COMMUNITY)
Admission: EM | Admit: 2022-11-20 | Discharge: 2022-11-20 | Disposition: A | Discharge status: Home / Self Care | Payer: No Typology Code available for payment source | Attending: Emergency Medicine | Admitting: Emergency Medicine

## 2022-11-20 ENCOUNTER — Other Ambulatory Visit: Payer: Self-pay

## 2022-11-20 ENCOUNTER — Encounter (HOSPITAL_COMMUNITY): Payer: Self-pay

## 2022-11-20 ENCOUNTER — Emergency Department (HOSPITAL_COMMUNITY): Payer: No Typology Code available for payment source

## 2022-11-20 DIAGNOSIS — L02211 Cutaneous abscess of abdominal wall: Secondary | ICD-10-CM | POA: Diagnosis not present

## 2022-11-20 DIAGNOSIS — M542 Cervicalgia: Secondary | ICD-10-CM | POA: Diagnosis not present

## 2022-11-20 DIAGNOSIS — S39012A Strain of muscle, fascia and tendon of lower back, initial encounter: Secondary | ICD-10-CM | POA: Diagnosis not present

## 2022-11-20 DIAGNOSIS — S3992XA Unspecified injury of lower back, initial encounter: Secondary | ICD-10-CM | POA: Diagnosis present

## 2022-11-20 DIAGNOSIS — Y92410 Unspecified street and highway as the place of occurrence of the external cause: Secondary | ICD-10-CM | POA: Insufficient documentation

## 2022-11-20 DIAGNOSIS — S0990XA Unspecified injury of head, initial encounter: Secondary | ICD-10-CM | POA: Diagnosis not present

## 2022-11-20 DIAGNOSIS — S299XXA Unspecified injury of thorax, initial encounter: Secondary | ICD-10-CM | POA: Insufficient documentation

## 2022-11-20 DIAGNOSIS — L0291 Cutaneous abscess, unspecified: Secondary | ICD-10-CM

## 2022-11-20 DIAGNOSIS — Z794 Long term (current) use of insulin: Secondary | ICD-10-CM | POA: Insufficient documentation

## 2022-11-20 MED ORDER — LIDOCAINE-EPINEPHRINE (PF) 2 %-1:200000 IJ SOLN
20.0000 mL | Freq: Once | INTRAMUSCULAR | Status: DC
  Filled 2022-11-20: qty 20

## 2022-11-20 MED ORDER — SULFAMETHOXAZOLE-TRIMETHOPRIM 800-160 MG PO TABS
1.0000 | ORAL_TABLET | Freq: Once | ORAL | Status: AC
  Administered 2022-11-20: 1 via ORAL
  Filled 2022-11-20: qty 1

## 2022-11-20 MED ORDER — SULFAMETHOXAZOLE-TRIMETHOPRIM 800-160 MG PO TABS: 1.0000 | ORAL_TABLET | Freq: Two times a day (BID) | ORAL | 0 refills | Status: AC
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
## 2022-11-20 NOTE — ED Notes (Signed)
I&D tray and lidocaine at bedside

## 2022-11-20 NOTE — ED Notes (Signed)
Pt taken to CT.
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
## 2023-01-01 ENCOUNTER — Other Ambulatory Visit: Payer: Self-pay

## 2023-01-01 ENCOUNTER — Emergency Department (HOSPITAL_COMMUNITY): Payer: Self-pay

## 2023-01-01 ENCOUNTER — Emergency Department (HOSPITAL_COMMUNITY)
Admission: EM | Admit: 2023-01-01 | Discharge: 2023-01-01 | Disposition: A | Discharge status: Home / Self Care | Payer: Self-pay | Attending: Emergency Medicine | Admitting: Emergency Medicine

## 2023-01-01 ENCOUNTER — Encounter (HOSPITAL_COMMUNITY): Payer: Self-pay | Admitting: *Deleted

## 2023-01-01 DIAGNOSIS — S40812A Abrasion of left upper arm, initial encounter: Secondary | ICD-10-CM | POA: Insufficient documentation

## 2023-01-01 DIAGNOSIS — I10 Essential (primary) hypertension: Secondary | ICD-10-CM | POA: Insufficient documentation

## 2023-01-01 DIAGNOSIS — Y92410 Unspecified street and highway as the place of occurrence of the external cause: Secondary | ICD-10-CM | POA: Insufficient documentation

## 2023-01-01 DIAGNOSIS — Z23 Encounter for immunization: Secondary | ICD-10-CM | POA: Insufficient documentation

## 2023-01-01 DIAGNOSIS — S40811A Abrasion of right upper arm, initial encounter: Secondary | ICD-10-CM | POA: Insufficient documentation

## 2023-01-01 DIAGNOSIS — E119 Type 2 diabetes mellitus without complications: Secondary | ICD-10-CM | POA: Insufficient documentation

## 2023-01-01 DIAGNOSIS — S80212A Abrasion, left knee, initial encounter: Secondary | ICD-10-CM | POA: Insufficient documentation

## 2023-01-01 MED ORDER — TETANUS-DIPHTH-ACELL PERTUSSIS 5-2.5-18.5 LF-MCG/0.5 IM SUSY
0.5000 mL | PREFILLED_SYRINGE | Freq: Once | INTRAMUSCULAR | Status: AC
  Administered 2023-01-01: 0.5 mL via INTRAMUSCULAR
  Filled 2023-01-01: qty 0.5

## 2023-01-01 MED ORDER — METHOCARBAMOL 500 MG PO TABS: 500.0000 mg | ORAL_TABLET | Freq: Two times a day (BID) | ORAL | 0 refills | Status: AC

## 2023-01-01 MED ORDER — OXYCODONE-ACETAMINOPHEN 5-325 MG PO TABS
1.0000 | ORAL_TABLET | Freq: Once | ORAL | Status: AC
  Administered 2023-01-01: 2 via ORAL
  Filled 2023-01-01: qty 2
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
